# Patient Record
Sex: Male | Born: 1975 | Race: White | Hispanic: No | Marital: Single | Smoking: Current every day smoker
Health system: Southern US, Community
[De-identification: ages and names within clinical notes are randomized; demographics above are authoritative.]

## PROBLEM LIST (undated history)

## (undated) DIAGNOSIS — S7290XA Unspecified fracture of unspecified femur, initial encounter for closed fracture: Secondary | ICD-10-CM

## (undated) DIAGNOSIS — I731 Thromboangiitis obliterans [Buerger's disease]: Secondary | ICD-10-CM

## (undated) HISTORY — DX: Unspecified fracture of unspecified femur, initial encounter for closed fracture: S72.90XA

---

## 2004-03-21 ENCOUNTER — Emergency Department: Payer: Self-pay | Admitting: Emergency Medicine

## 2007-07-05 ENCOUNTER — Ambulatory Visit: Payer: Self-pay

## 2007-07-20 ENCOUNTER — Ambulatory Visit: Payer: Self-pay | Admitting: Orthopaedic Surgery

## 2007-07-26 ENCOUNTER — Ambulatory Visit: Payer: Self-pay | Admitting: Orthopaedic Surgery

## 2007-08-02 ENCOUNTER — Ambulatory Visit: Payer: Self-pay | Admitting: Specialist

## 2007-08-02 IMAGING — XA DG CHEST 1V
2 series · 2 of 2 positions shown · non-contrast
Comparison: none

REASON FOR EXAM: PICC Line Placement
COMMENTS:

PROCEDURE:     VAS - CHEST FRONTAL SINGLE VIEW  - [DATE]  [DATE]
RESULT:     Comparison: No available comparison exam.

[Series 1: run · 1 of 1 slices shown (1 of 2)]
[im 1/1]
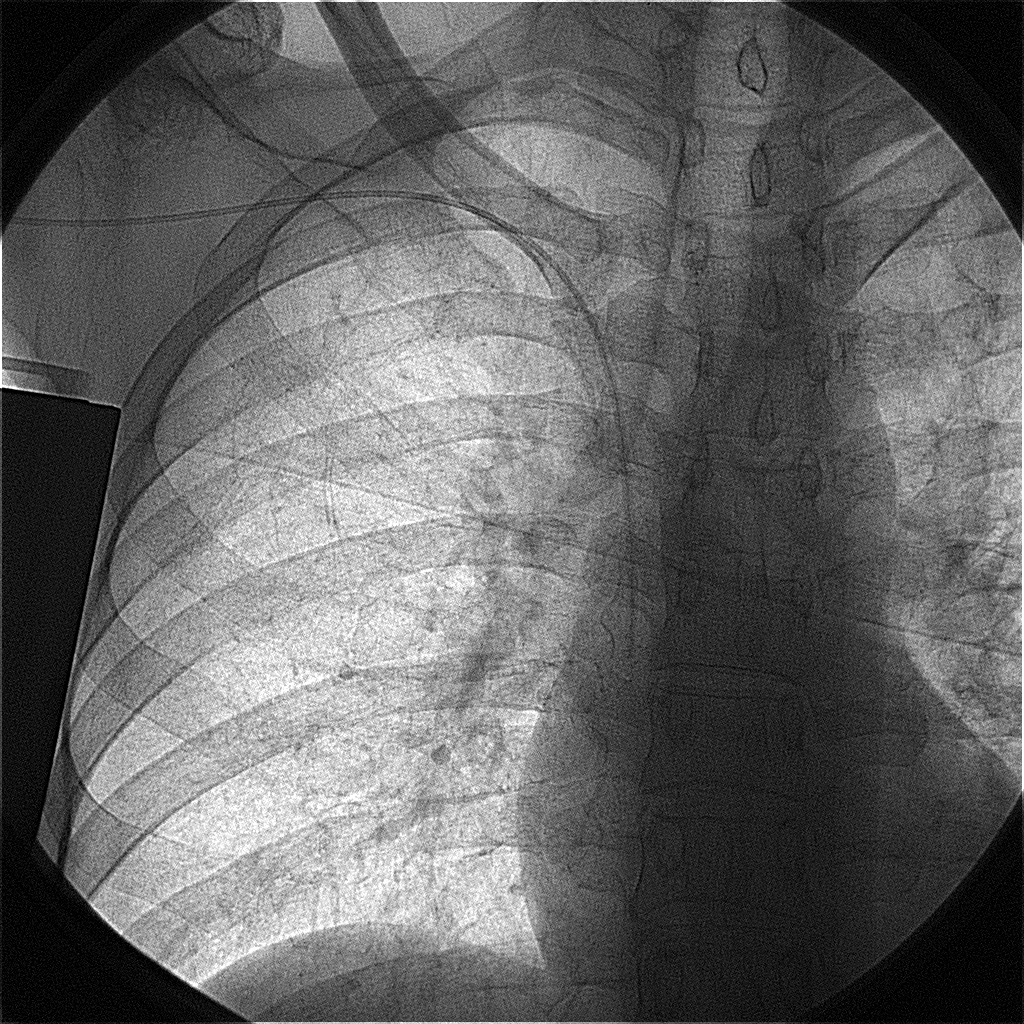

[Series 1: run · 1 of 1 slices shown (2 of 2)]
[im 1/1]
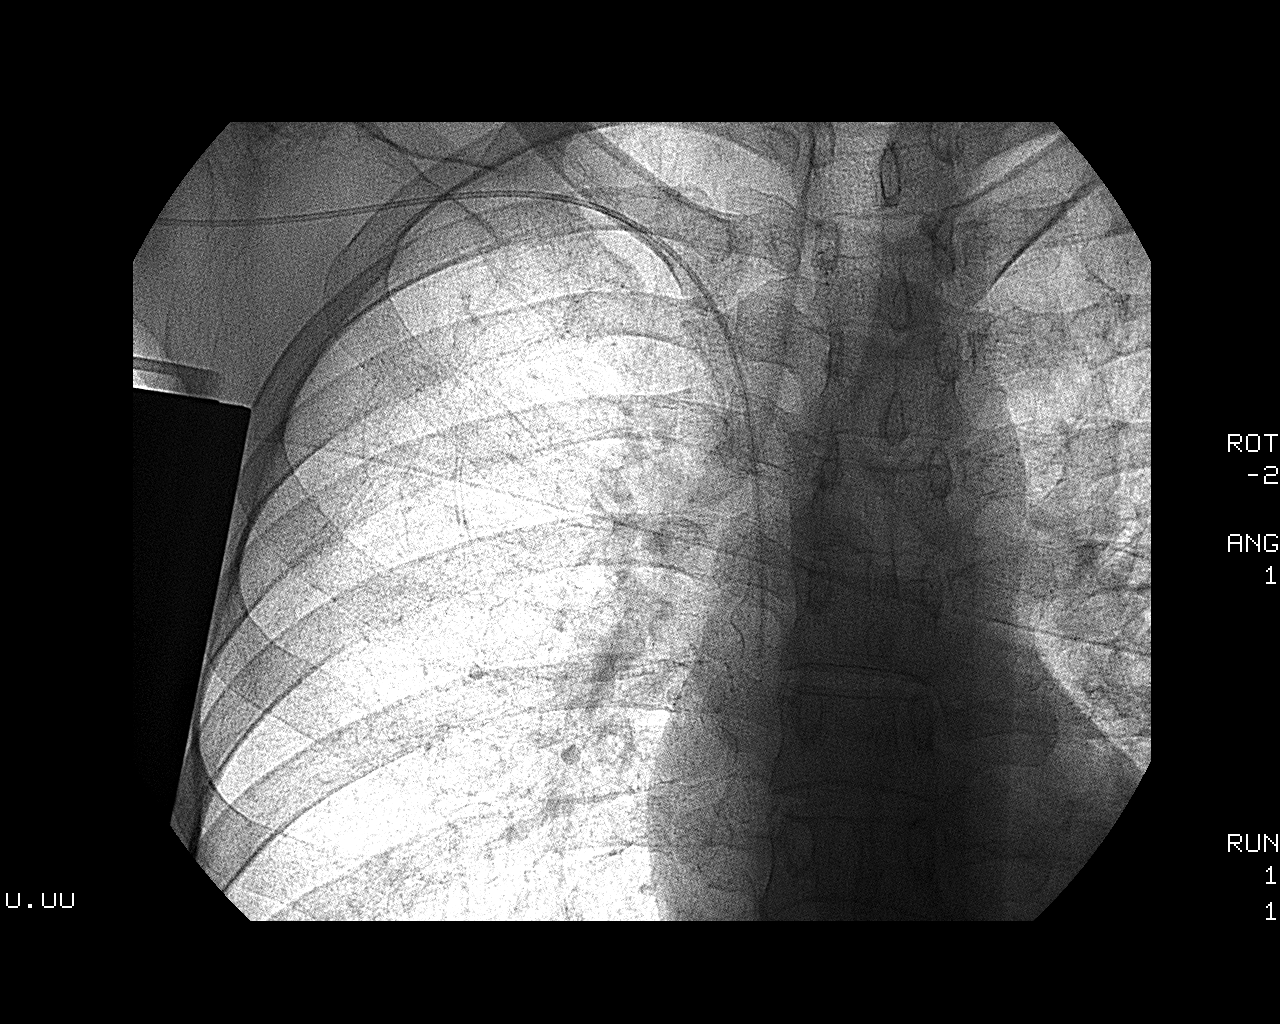

[2 of 2 positions shown; findings below may reference images not displayed]

FINDINGS: Fluoroscopic spot image of the right chest shows a right PICC with distal
tip at the SVC-RA junction.
IMPRESSION: 1. Right PICC with distal tip at the SVC-RA junction.

## 2007-08-31 ENCOUNTER — Ambulatory Visit: Payer: Self-pay | Admitting: Specialist

## 2010-12-25 ENCOUNTER — Observation Stay: Payer: Self-pay | Admitting: Internal Medicine

## 2016-05-23 DIAGNOSIS — I10 Essential (primary) hypertension: Secondary | ICD-10-CM | POA: Diagnosis not present

## 2016-05-23 DIAGNOSIS — Z0000 Encounter for general adult medical examination without abnormal findings: Secondary | ICD-10-CM | POA: Diagnosis not present

## 2016-05-23 DIAGNOSIS — Z79899 Other long term (current) drug therapy: Secondary | ICD-10-CM | POA: Diagnosis not present

## 2016-05-23 DIAGNOSIS — M5137 Other intervertebral disc degeneration, lumbosacral region: Secondary | ICD-10-CM | POA: Diagnosis not present

## 2016-06-13 DIAGNOSIS — Z125 Encounter for screening for malignant neoplasm of prostate: Secondary | ICD-10-CM | POA: Diagnosis not present

## 2016-06-13 DIAGNOSIS — Z79899 Other long term (current) drug therapy: Secondary | ICD-10-CM | POA: Diagnosis not present

## 2016-06-13 DIAGNOSIS — Z13220 Encounter for screening for lipoid disorders: Secondary | ICD-10-CM | POA: Diagnosis not present

## 2016-06-13 DIAGNOSIS — I10 Essential (primary) hypertension: Secondary | ICD-10-CM | POA: Diagnosis not present

## 2016-07-11 DIAGNOSIS — M5137 Other intervertebral disc degeneration, lumbosacral region: Secondary | ICD-10-CM | POA: Diagnosis not present

## 2016-07-11 DIAGNOSIS — I10 Essential (primary) hypertension: Secondary | ICD-10-CM | POA: Diagnosis not present

## 2016-08-19 DIAGNOSIS — D225 Melanocytic nevi of trunk: Secondary | ICD-10-CM | POA: Diagnosis not present

## 2016-08-19 DIAGNOSIS — Z0389 Encounter for observation for other suspected diseases and conditions ruled out: Secondary | ICD-10-CM | POA: Diagnosis not present

## 2016-08-19 DIAGNOSIS — M79645 Pain in left finger(s): Secondary | ICD-10-CM | POA: Diagnosis not present

## 2016-08-19 DIAGNOSIS — M79602 Pain in left arm: Secondary | ICD-10-CM | POA: Diagnosis not present

## 2016-09-13 ENCOUNTER — Other Ambulatory Visit: Payer: Self-pay | Admitting: Physical Medicine and Rehabilitation

## 2016-09-13 DIAGNOSIS — M5416 Radiculopathy, lumbar region: Secondary | ICD-10-CM

## 2016-09-13 DIAGNOSIS — M5136 Other intervertebral disc degeneration, lumbar region: Secondary | ICD-10-CM | POA: Diagnosis not present

## 2016-09-23 ENCOUNTER — Ambulatory Visit
Admission: RE | Admit: 2016-09-23 | Discharge: 2016-09-23 | Disposition: A | Discharge status: Home / Self Care | Payer: BLUE CROSS/BLUE SHIELD | Source: Ambulatory Visit | Attending: Physical Medicine and Rehabilitation | Admitting: Physical Medicine and Rehabilitation

## 2016-09-23 DIAGNOSIS — M5416 Radiculopathy, lumbar region: Secondary | ICD-10-CM

## 2016-09-23 DIAGNOSIS — M545 Low back pain: Secondary | ICD-10-CM | POA: Insufficient documentation

## 2016-11-21 DIAGNOSIS — M5137 Other intervertebral disc degeneration, lumbosacral region: Secondary | ICD-10-CM | POA: Diagnosis not present

## 2016-11-21 DIAGNOSIS — I10 Essential (primary) hypertension: Secondary | ICD-10-CM | POA: Diagnosis not present

## 2016-11-21 DIAGNOSIS — R202 Paresthesia of skin: Secondary | ICD-10-CM | POA: Diagnosis not present

## 2016-11-21 DIAGNOSIS — Z79899 Other long term (current) drug therapy: Secondary | ICD-10-CM | POA: Diagnosis not present

## 2016-11-21 DIAGNOSIS — M79642 Pain in left hand: Secondary | ICD-10-CM | POA: Diagnosis not present

## 2016-12-09 DIAGNOSIS — I731 Thromboangiitis obliterans [Buerger's disease]: Secondary | ICD-10-CM | POA: Diagnosis not present

## 2017-01-06 ENCOUNTER — Ambulatory Visit (INDEPENDENT_AMBULATORY_CARE_PROVIDER_SITE_OTHER): Payer: BLUE CROSS/BLUE SHIELD | Admitting: Vascular Surgery

## 2017-01-06 ENCOUNTER — Encounter (INDEPENDENT_AMBULATORY_CARE_PROVIDER_SITE_OTHER): Payer: Self-pay | Admitting: Vascular Surgery

## 2017-01-06 ENCOUNTER — Encounter (INDEPENDENT_AMBULATORY_CARE_PROVIDER_SITE_OTHER): Payer: Self-pay

## 2017-01-06 VITALS — BP 120/77 | HR 72 | Resp 16 | Ht 69.0 in | Wt 141.0 lb

## 2017-01-06 DIAGNOSIS — F17200 Nicotine dependence, unspecified, uncomplicated: Secondary | ICD-10-CM | POA: Diagnosis not present

## 2017-01-06 DIAGNOSIS — I731 Thromboangiitis obliterans [Buerger's disease]: Secondary | ICD-10-CM | POA: Insufficient documentation

## 2017-01-06 NOTE — Patient Instructions (Signed)
Angiogram  An angiogram is an X-ray test. It is used to look at your blood vessels. For this test, a dye is put into the blood vessel being checked. The dye shows up on X-rays. It helps your doctor see if there is a blockage or other problem in the blood vessel.  What happens before the procedure?   Follow your doctor's instructions about limiting what you eat or drink.   Ask your doctor if you may drink enough water to take any needed medicines the morning of the test.   Plan to have someone take you home after the test.   If you go home the same day as the test, plan to have someone stay with you for 24 hours.  What happens during the procedure?   An IV tube will be put into one of your veins.   You will be given a medicine that makes you relax (sedative).   Your skin will be washed where the thin tube (catheter) will be put in. Hair may be removed from this area. The tube may be put into:  ? Your upper leg area (groin).  ? The fold of your arm, near your elbow.  ? Your wrist.   You will be given a medicine that numbs the area where the tube will be inserted (local anesthetic).   The tube will be inserted into a blood vessel.   Using a type of X-ray (fluoroscopy) to see, your doctor will move the tube into the blood vessel to check it.   Dye will be put in through the tube. X-rays of your blood vessels will then be taken.  Different doctors and hospitals may do this procedure differently.  What happens after the procedure?   If the test is done through the leg, you will be kept in bed lying flat for several hours. You will be told to not bend or cross your legs.   The area where the tube was inserted will be checked often.   The pulse in your feet or wrist will be checked often.   More tests or X-rays may be done.  This information is not intended to replace advice given to you by your health care provider. Make sure you discuss any questions you have with your health care provider.  Document  Released: 07/01/2008 Document Revised: 09/10/2015 Document Reviewed: 09/05/2012  Elsevier Interactive Patient Education  2017 [employer]

## 2017-01-06 NOTE — Assessment & Plan Note (Signed)
The patient gives a reasonably typical history for Buerger's disease in a young healthy male with tobacco use and digital pain without another obvious source. I have discussed with him the pathophysiology and natural history of Buerger's disease. I discussed that other etiologies are certainly possible such as were nodes phenomenon or embolic source from the proximal arterial lesion. I recommended an angiogram for full evaluation of the extremity perfusion and this would be the only reliable way to definitively diagnose Buerger's disease. I have discussed the risks and benefits of angiography. The patient desires to have this performed and we will proceed as such I recommended he take an 81 mg aspirin a day and has described above recommended tobacco cessation.

## 2017-01-06 NOTE — Assessment & Plan Note (Signed)
We had a discussion for approximately 3-4 minutes regarding the absolute need for smoking cessation due to the deleterious nature of tobacco on the vascular system. We discussed the tobacco use would diminish patency of any intervention, and likely significantly worsen progressio of disease. We discussed multiple agents for quitting including replacement therapy or medications to reduce cravings such as Chantix. The patient voices their understanding of the importance of smoking cessation. We discussed that if he does indeed had Buerger's disease, the only reliable treatment for this smoking cessation and without smoking cessation he is likely to have digital loss and other tissue loss occur.

## 2017-01-06 NOTE — Progress Notes (Signed)
Patient ID: Jamie Buchanan, male   DOB: 02/28/1976, 41 y.o.   MRN: [id number]  Chief Complaint  Patient presents with  . New Patient (Initial Visit)    Middle finger cold and numb    HPI Jamie Buchanan is a 41 y.o. male.  I am asked to see the patient by Dr. Rosita Kea for evaluation of finger pain and possible Buerger's disease.  The patient reports Several months ago having pain in his left third finger. This is exacerbated by cold stimulation. It has made it very difficult to work and the finger is very sensitive to the touch. He denies any other extremity pain or problems at current. He has long-standing tobacco use. He denies any trauma or injury inciting event that caused the symptoms. Nothing has made this better over the past several months. Cold stimulation significantly worsens the pain. He denies symptoms in the right hand or the feet.   Past Medical History:  Diagnosis Date  . Broken femur (HCC)    Age 61    No past surgical history on file.  Family History  Problem Relation Age of Onset  . Diabetes Mother   . Heart disease Mother   . Sinusitis Father   No bleeding disorders or clotting disorders  Social History Social History  Substance Use Topics  . Smoking status: Current Every Day Smoker  . Smokeless tobacco: Never Used  . Alcohol use Yes  No IV drug use  Allergies  Allergen Reactions  . Penicillin G Rash    Current Outpatient Prescriptions  Medication Sig Dispense Refill  . bisoprolol-hydrochlorothiazide (ZIAC) 5-6.25 MG tablet Take by mouth.    . gabapentin (NEURONTIN) 300 MG capsule Take by mouth.     No current facility-administered medications for this visit.       REVIEW OF SYSTEMS (Negative unless checked)  Constitutional: Weight loss  Fever  Chills Cardiac: Chest pain   Chest pressure   Palpitations   Shortness of breath when laying flat   Shortness of breath at rest   Shortness of breath with exertion. Vascular:  Pain in  legs with walking   Pain in legs at rest   Pain in legs when laying flat   Claudication   Pain in feet when walking  Pain in feet at rest  Pain in feet when laying flat   History of DVT   Phlebitis   Swelling in legs   Varicose veins   Non-healing ulcers Pulmonary:   Uses home oxygen   Productive cough   Hemoptysis   Wheeze  COPD   Asthma Neurologic:  Dizziness  Blackouts   Seizures   History of stroke   History of TIA  Aphasia   Temporary blindness   Dysphagia   Weakness or numbness in arms   Weakness or numbness in legs Musculoskeletal:  Arthritis   Joint swelling   Joint pain   Low back pain Hematologic:  Easy bruising  Easy bleeding   Hypercoagulable state   Anemic  Hepatitis Gastrointestinal:  Blood in stool   Vomiting blood  Gastroesophageal reflux/heartburn   Abdominal pain Genitourinary:  Chronic kidney disease   Difficult urination  Frequent urination  Burning with urination   Hematuria Skin:  Rashes   Ulcers   Wounds Psychological:  History of anxiety    History of major depression.    Physical Exam BP 120/77 (BP Location: Right Arm)   Pulse 72   Resp 16   Ht  (  1.753 m)   Wt 141 lb (64 kg)   BMI 20.82 kg/m  Gen:  WD/WN, NAD Head: [place]/AT, No temporalis wasting.  Ear/Nose/Throat: Hearing grossly intact, nares w/o erythema or drainage, oropharynx w/o Erythema/Exudate Eyes: Conjunctiva clear, sclera non-icteric  Neck: trachea midline.  No JVD.  Pulmonary:  Good air movement, respirations not labored, no use of accessory muscles Cardiac: RRR, no JVD Vascular:  Vessel Right Left  Radial Palpable Palpable  Ulnar Palpable Palpable  Brachial Palpable Palpable                            Musculoskeletal: M/S 5/5 throughout.  No deformity or atrophy. No edema. Appearance of a scab at the distal aspect of the left third finger with no open ulceration or infection. Neurologic:  Sensation grossly intact in extremities.  Symmetrical.  Speech is fluent. Motor exam as listed above. Psychiatric: Judgment intact, Mood & affect appropriate for pt's clinical situation. Dermatologic: No rashes or ulcers noted.  No cellulitis or open wounds.    Radiology No results found.  Labs No results found for this or any previous visit (from the past 2160 hour(s)).  Assessment/Plan:  Tobacco use disorder We had a discussion for approximately 3-4 minutes regarding the absolute need for smoking cessation due to the deleterious nature of tobacco on the vascular system. We discussed the tobacco use would diminish patency of any intervention, and likely significantly worsen progressio of disease. We discussed multiple agents for quitting including replacement therapy or medications to reduce cravings such as Chantix. The patient voices their understanding of the importance of smoking cessation. We discussed that if he does indeed had Buerger's disease, the only reliable treatment for this smoking cessation and without smoking cessation he is likely to have digital loss and other tissue loss occur.   Buerger's disease (HCC) The patient gives a reasonably typical history for Buerger's disease in a young healthy male with tobacco use and digital pain without another obvious source. I have discussed with him the pathophysiology and natural history of Buerger's disease. I discussed that other etiologies are certainly possible such as were nodes phenomenon or embolic source from the proximal arterial lesion. I recommended an angiogram for full evaluation of the extremity perfusion and this would be the only reliable way to definitively diagnose Buerger's disease. I have discussed the risks and benefits of angiography. The patient desires to have this performed and we will proceed as such I recommended he take an 81 mg aspirin a day and has described above recommended tobacco cessation.      Festus Barren 01/06/2017, 9:42 AM   This note was created with Dragon medical transcription system.  Any errors from dictation are unintentional.

## 2017-01-09 ENCOUNTER — Other Ambulatory Visit (INDEPENDENT_AMBULATORY_CARE_PROVIDER_SITE_OTHER): Payer: Self-pay | Admitting: Vascular Surgery

## 2017-01-15 MED ORDER — CLINDAMYCIN PHOSPHATE 300 MG/50ML IV SOLN
300.0000 mg | Freq: Once | INTRAVENOUS | Status: AC
  Administered 2017-01-16: 300 mg via INTRAVENOUS

## 2017-01-16 ENCOUNTER — Encounter
Admission: RE | Disposition: A | Discharge status: Home / Self Care | Payer: Self-pay | Source: Ambulatory Visit | Attending: Vascular Surgery

## 2017-01-16 ENCOUNTER — Encounter: Payer: Self-pay | Admitting: *Deleted

## 2017-01-16 ENCOUNTER — Ambulatory Visit
Admission: RE | Admit: 2017-01-16 | Discharge: 2017-01-16 | Disposition: A | Discharge status: Home / Self Care | Payer: BLUE CROSS/BLUE SHIELD | Source: Ambulatory Visit | Attending: Vascular Surgery | Admitting: Vascular Surgery

## 2017-01-16 DIAGNOSIS — Z8249 Family history of ischemic heart disease and other diseases of the circulatory system: Secondary | ICD-10-CM | POA: Insufficient documentation

## 2017-01-16 DIAGNOSIS — F17200 Nicotine dependence, unspecified, uncomplicated: Secondary | ICD-10-CM | POA: Diagnosis not present

## 2017-01-16 DIAGNOSIS — I998 Other disorder of circulatory system: Secondary | ICD-10-CM | POA: Diagnosis not present

## 2017-01-16 DIAGNOSIS — Z880 Allergy status to penicillin: Secondary | ICD-10-CM | POA: Insufficient documentation

## 2017-01-16 DIAGNOSIS — Z833 Family history of diabetes mellitus: Secondary | ICD-10-CM | POA: Insufficient documentation

## 2017-01-16 HISTORY — PX: UPPER EXTREMITY VENOGRAPHY: CATH118272

## 2017-01-16 HISTORY — DX: Thromboangiitis obliterans (Buerger's disease): I73.1

## 2017-01-16 LAB — CBC
HEMATOCRIT: 59.3 % — AB (ref 40.0–52.0)
HEMOGLOBIN: 20 g/dL — AB (ref 13.0–18.0)
MCH: 32 pg (ref 26.0–34.0)
MCHC: 33.7 g/dL (ref 32.0–36.0)
MCV: 94.8 fL (ref 80.0–100.0)
Platelets: 283 10*3/uL (ref 150–440)
RBC: 6.26 MIL/uL — ABNORMAL HIGH (ref 4.40–5.90)
RDW: 14.5 % (ref 11.5–14.5)
WBC: 12.4 10*3/uL — ABNORMAL HIGH (ref 3.8–10.6)

## 2017-01-16 LAB — CREATININE, SERUM
Creatinine, Ser: 1.13 mg/dL (ref 0.61–1.24)
GFR calc Af Amer: >60 mL/min (ref >60)
GFR calc non Af Amer: >60 mL/min (ref >60)

## 2017-01-16 LAB — BUN: BUN: 11 mg/dL (ref 6–20)

## 2017-01-16 LAB — GLUCOSE, CAPILLARY
GLUCOSE-CAPILLARY: 49 mg/dL — AB (ref 65–99)
Glucose-Capillary: 92 mg/dL (ref 65–99)

## 2017-01-16 SURGERY — UPPER EXTREMITY VENOGRAPHY
Anesthesia: Moderate Sedation | Laterality: Bilateral

## 2017-01-16 MED ORDER — HEPARIN SODIUM (PORCINE) 1000 UNIT/ML IJ SOLN
INTRAMUSCULAR | Status: AC
  Filled 2017-01-16: qty 1

## 2017-01-16 MED ORDER — ONDANSETRON HCL 4 MG/2ML IJ SOLN: 4.0000 mg | Freq: Four times a day (QID) | INTRAMUSCULAR | Status: DC | PRN

## 2017-01-16 MED ORDER — SODIUM CHLORIDE 0.9 % IV SOLN: INTRAVENOUS | Status: DC

## 2017-01-16 MED ORDER — HYDRALAZINE HCL 20 MG/ML IJ SOLN: 5.0000 mg | INTRAMUSCULAR | Status: DC | PRN

## 2017-01-16 MED ORDER — METHYLPREDNISOLONE SODIUM SUCC 125 MG IJ SOLR: 125.0000 mg | INTRAMUSCULAR | Status: DC | PRN

## 2017-01-16 MED ORDER — ASPIRIN EC 81 MG PO TBEC: 81.0000 mg | DELAYED_RELEASE_TABLET | Freq: Every day | ORAL | Status: DC

## 2017-01-16 MED ORDER — IOPAMIDOL (ISOVUE-300) INJECTION 61%
INTRAVENOUS | Status: DC | PRN
  Administered 2017-01-16: 50 mL via INTRA_ARTERIAL

## 2017-01-16 MED ORDER — ASPIRIN EC 81 MG PO TBEC: 81.0000 mg | DELAYED_RELEASE_TABLET | Freq: Every day | ORAL | 2 refills | Status: AC

## 2017-01-16 MED ORDER — SODIUM CHLORIDE 0.9 % IV SOLN: 250.0000 mL | INTRAVENOUS | Status: DC | PRN

## 2017-01-16 MED ORDER — MIDAZOLAM HCL 5 MG/5ML IJ SOLN
INTRAMUSCULAR | Status: AC
  Filled 2017-01-16: qty 5

## 2017-01-16 MED ORDER — FAMOTIDINE 20 MG PO TABS: 40.0000 mg | ORAL_TABLET | ORAL | Status: DC | PRN

## 2017-01-16 MED ORDER — FENTANYL CITRATE (PF) 100 MCG/2ML IJ SOLN
INTRAMUSCULAR | Status: AC
  Filled 2017-01-16: qty 2

## 2017-01-16 MED ORDER — LIDOCAINE-EPINEPHRINE (PF) 1 %-1:200000 IJ SOLN
INTRAMUSCULAR | Status: AC
  Filled 2017-01-16: qty 30

## 2017-01-16 MED ORDER — SODIUM CHLORIDE 0.9% FLUSH: 3.0000 mL | INTRAVENOUS | Status: DC | PRN

## 2017-01-16 MED ORDER — FENTANYL CITRATE (PF) 100 MCG/2ML IJ SOLN
INTRAMUSCULAR | Status: DC | PRN
  Administered 2017-01-16: 50 ug via INTRAVENOUS
  Administered 2017-01-16: 25 ug via INTRAVENOUS

## 2017-01-16 MED ORDER — SODIUM CHLORIDE 0.9% FLUSH: 3.0000 mL | Freq: Two times a day (BID) | INTRAVENOUS | Status: DC

## 2017-01-16 MED ORDER — HYDROMORPHONE HCL 1 MG/ML IJ SOLN: 1.0000 mg | Freq: Once | INTRAMUSCULAR | Status: DC | PRN

## 2017-01-16 MED ORDER — LABETALOL HCL 5 MG/ML IV SOLN: 10.0000 mg | INTRAVENOUS | Status: DC | PRN

## 2017-01-16 MED ORDER — HEPARIN (PORCINE) IN NACL 2-0.9 UNIT/ML-% IJ SOLN
INTRAMUSCULAR | Status: AC
  Filled 2017-01-16: qty 1000

## 2017-01-16 MED ORDER — DEXTROSE 50 % IV SOLN
INTRAVENOUS | Status: AC
  Administered 2017-01-16: 50 mL
  Filled 2017-01-16: qty 50

## 2017-01-16 MED ORDER — MIDAZOLAM HCL 2 MG/2ML IJ SOLN
INTRAMUSCULAR | Status: DC | PRN
  Administered 2017-01-16: 2 mg via INTRAVENOUS
  Administered 2017-01-16: 1 mg via INTRAVENOUS

## 2017-01-16 MED ORDER — HEPARIN SODIUM (PORCINE) 1000 UNIT/ML IJ SOLN
INTRAMUSCULAR | Status: DC | PRN
  Administered 2017-01-16: 3000 [IU] via INTRAVENOUS

## 2017-01-16 MED ORDER — CLINDAMYCIN PHOSPHATE 300 MG/50ML IV SOLN
INTRAVENOUS | Status: AC
  Filled 2017-01-16: qty 50

## 2017-01-16 SURGICAL SUPPLY — 10 items
CATH ANGIO 5F 100CM .035 PIG (CATHETERS) ×3 IMPLANT
CATH BEACON 5 .035 100 H1 TIP (CATHETERS) ×3 IMPLANT
CATH CXI SUPP ANG 4FR 135 (MICROCATHETER) ×1 IMPLANT
CATH CXI SUPP ANG 4FR 135CM (MICROCATHETER) ×3
DEVICE STARCLOSE SE CLOSURE (Vascular Products) ×3 IMPLANT
PACK ANGIOGRAPHY (CUSTOM PROCEDURE TRAY) ×3 IMPLANT
SHEATH BRITE TIP 5FRX11 (SHEATH) ×3 IMPLANT
SYR MEDRAD MARK V 150ML (SYRINGE) ×3 IMPLANT
TUBING CONTRAST HIGH PRESS 72 (TUBING) ×3 IMPLANT
WIRE J 3MM .035X145CM (WIRE) ×3 IMPLANT

## 2017-01-16 NOTE — H&P (Signed)
[facility] History & Physical Update  The patient was interviewed and re-examined.  The patient's previous History and Physical has been reviewed and is unchanged.  There is no change in the plan of care. We plan to proceed with the scheduled procedure.  Festus Barren, MD  01/16/2017, 10:22 AM

## 2017-01-16 NOTE — Progress Notes (Signed)
Blood sugar up to 90's, shivering, asking for extra blankets. Dr. Wyn Quaker notified.

## 2017-01-16 NOTE — Discharge Instructions (Signed)

## 2017-01-16 NOTE — OR Nursing (Signed)
10:20 came in for vascular procedure. Diaphoretic and c/o of feeling very weak.   Blood sugar 48. MD notified and 1 amp D50 given IV.

## 2017-01-16 NOTE — Op Note (Signed)
OPERATIVE REPORT   PREOPERATIVE DIAGNOSIS: 1. ischemia left third finger 2.  suspected Buerger's disease 3. Tobacco dependence  POSTOPERATIVE DIAGNOSIS: Ischemia left third finger, tobacco dependence, no evidence of Buerger's disease  PROCEDURE PERFORMED: 1. Ultrasound guidance vascular access to right femoral artery. 2. Catheter placement to  bilateral radial arteries and bilateral ulnar arteries  from right femoral approach. 3. Thoracic aortogram and selective bilateral upper extremity angiogram  including selective images of the radial and ulnar arteriesbilaterally 4. StarClose closure device right femoral artery.  SURGEON: Annice Needy, MD  ANESTHESIA: Local with moderate conscious sedation for 30 minutes using 3 mg of Versed and 75 mcg of Fentanyl  BLOOD LOSS: Minimal.  FLUOROSCOPY TIME: 5.4 minutes  INDICATION FOR PROCEDURE: This is a 41 y.o.male who presented to our office with ischemia of the left third finger. He was young with a heavy history of tobacco use and we suspected Buerger's disease. Angiogram is performed for delineation of his arterial anatomy as well as assessment of the right upper extremity vascular disease. Risks and benefits are discussed. Informed consent was obtained.  DESCRIPTION OF PROCEDURE: The patient was brought to the vascular suite. Moderate conscious sedation was administered during a face to face encounter with the patient throughout the procedure with my supervision of the RN administering medicines and monitoring the patient's vital signs, pulse oximetry, telemetry and mental status throughout from the start of the procedure until the patient was taken to the recovery room.  Groins were shaved and prepped and sterile surgical field was created. The right femoral head was localized with fluoroscopy and the right femoral artery was then visualized with ultrasound and found to be widely patent. It was then accessed  under direct ultrasound guidance without difficulty with a Seldinger needle and a permanent image was recorded. A J-wire and 5-French sheath were then placed. Pigtail catheter was placed into the ascending aorta and a thoracic aortogram was then performed in the LAO projection. This demonstrated normal origins to the great vessels without significant proximal stenoses and a normal configuration of the great vessels. The patient was given 3000 units of intravenous heparin and a headhunter catheter was used to selectively cannulate the left subclavian artery without difficulty. This was then sequentially advanced to the brachial artery and to the brachial bifurcationafter exchange for a CXI catheter to get past the brachial bifurcation and selective imaging of both the radial and ulnar arteries were performed. findings of left upper extremity demonstrated no significant stenosis in the left subclavian artery, axillary artery, brachial artery, with a normal brachial bifurcation. The radial artery was larger artery into the hand and was continuous without significant stenosis. The ulnar artery was smaller but was also continuous without focal stenosis. He had an incomplete palmar arch and there was very poor filling of the third digit from either the radial or the ulnar approach. There were no corkscrew appearances or signs of Buerger's disease. I then elected to evaluate the right arm. We will back to the thoracic aortogram projection and used a headhunter catheter to selectively cannulate the innominate artery without difficulty using a Glidewire. I then advanced into the right subclavian artery and sequentially advanced to the brachial artery which was the limit on the length of the catheter. I exchanged for a CXI catheter and first cannulated the radial artery for selective imaging and then pulled back and cannulated the ulnar artery for selective imaging into the hand. Findings on the right upper  extremity demonstrated no significant stenosis  in the innominate artery, right subclavian artery, axillary artery, or brachial artery. The radial artery was again the larger artery into the hand but did not demonstrate great cross filling through the palmar arch to digits 4 and 5. The third digit was better perfused. Selective imaging of the ulnar artery did show cross filling into all 5 digits with a better palmar arch that was seen on the left hand. No focal stenosis was seen in either the radial or the ulnar artery. No evidence of Buerger's disease or corkscrew appearance of collaterals was seen. The diagnostic catheter was removed. Oblique arteriogram was performed of the right femoral artery and StarClose closure device deployed in the usual fashion with excellent hemostatic result. The patient tolerated the procedure well and was taken to the recovery room in stable condition.   Jamie Buchanan 01/16/2017 12:58 PM

## 2017-01-17 ENCOUNTER — Encounter: Payer: Self-pay | Admitting: Vascular Surgery

## 2017-01-31 ENCOUNTER — Ambulatory Visit (INDEPENDENT_AMBULATORY_CARE_PROVIDER_SITE_OTHER): Payer: BLUE CROSS/BLUE SHIELD | Admitting: Vascular Surgery

## 2017-01-31 ENCOUNTER — Encounter (INDEPENDENT_AMBULATORY_CARE_PROVIDER_SITE_OTHER): Payer: Self-pay | Admitting: Vascular Surgery

## 2017-01-31 VITALS — BP 141/72 | HR 86 | Resp 15 | Ht 69.0 in | Wt 142.0 lb

## 2017-01-31 DIAGNOSIS — I731 Thromboangiitis obliterans [Buerger's disease]: Secondary | ICD-10-CM | POA: Diagnosis not present

## 2017-01-31 DIAGNOSIS — F17200 Nicotine dependence, unspecified, uncomplicated: Secondary | ICD-10-CM | POA: Diagnosis not present

## 2017-01-31 NOTE — Progress Notes (Signed)
Subjective:    Patient ID: Jamie Buchanan, male    DOB: 20-Nov-1975, 41 y.o.   MRN: [id number] Chief Complaint  Patient presents with  . Follow-up    2 week f/u    Patient presents for his first post procedure follow-up. He is status post a ultrasound guidance vascular access to right femoral artery. Catheter placement to  bilateral radial arteries and bilateral ulnar arteries from right femoral approach. Thoracic aortogram and selective bilateral upper extremity angiogram including selective images of the radial and ulnar arteries bilaterally with StarClose closure device right femoral artery. His symptoms persist status post the above procedure. Patient continues having pain in his left third finger. Denies any pallor or necrosis to the finger. The patient has been taking  aspirin daily. Patient has not stopped smoking tobacco as of yet. Denies any worsening of the pain. Denies any fever, nausea or vomiting.   Review of Systems  Constitutional: Negative.   HENT: Negative.   Eyes: Negative.   Respiratory: Negative.   Cardiovascular:       Finger pain  Gastrointestinal: Negative.   Endocrine: Negative.   Genitourinary: Negative.   Musculoskeletal: Negative.   Skin: Negative.   Allergic/Immunologic: Negative.   Neurological: Negative.   Hematological: Negative.   Psychiatric/Behavioral: Negative.       Objective:   Physical Exam  Constitutional: He is oriented to person, place, and time. He appears well-developed and well-nourished. No distress.  HENT:  Head: Normocephalic and atraumatic.  Eyes: Pupils are equal, round, and reactive to light. Conjunctivae are normal.  Neck: Normal range of motion.  Cardiovascular: Normal rate, regular rhythm, normal heart sounds and intact distal pulses.   Pulses:      Radial pulses are 2+ on the right side, and 2+ on the left side.  The bilateral hand and fingers are warm and pink. Skin is intact. There is no signs of ischemia. Skin is  intact.  Pulmonary/Chest: Effort normal.  Musculoskeletal: Normal range of motion. He exhibits no edema.  Neurological: He is alert and oriented to person, place, and time.  Skin: Skin is warm and dry. He is not diaphoretic.  Psychiatric: He has a normal mood and affect. His behavior is normal. Judgment and thought content normal.  Vitals reviewed.  BP (!) 141/72 (BP Location: Right Arm)   Pulse 86   Resp 15   Ht  (1.753 m)   Wt 142 lb (64.4 kg)   BMI 20.97 kg/m   Past Medical History:  Diagnosis Date  . Broken femur (HCC)    Age 41  . Buerger disease (HCC)    Social History   Social History  . Marital status: Single    Spouse name: N/A  . Number of children: N/A  . Years of education: N/A   Occupational History  . Not on file.   Social History Main Topics  . Smoking status: Current Every Day Smoker    Packs/day: 2.00    Years: 15.00  . Smokeless tobacco: Never Used  . Alcohol use Yes  . Drug use: Unknown  . Sexual activity: Not on file   Other Topics Concern  . Not on file   Social History Narrative  . No narrative on file   Past Surgical History:  Procedure Laterality Date  . UPPER EXTREMITY VENOGRAPHY Bilateral 01/16/2017   Procedure: Upper Extremity Venography;  Surgeon: Annice Needy, MD;  Location: [facility];  Service: Cardiovascular;  Laterality: Bilateral;  Family History  Problem Relation Age of Onset  . Diabetes Mother   . Heart disease Mother   . Sinusitis Father    Allergies  Allergen Reactions  . Penicillin G Rash      Assessment & Plan:  Patient presents for his first post procedure follow-up. He is status post a ultrasound guidance vascular access to right femoral artery. Catheter placement to  bilateral radial arteries and bilateral ulnar arteries from right femoral approach. Thoracic aortogram and selective bilateral upper extremity angiogram including selective images of the radial and ulnar arteries bilaterally with  StarClose closure device right femoral artery. His symptoms persist status post the above procedure. Patient continues having pain in his left third finger. Denies any pallor or necrosis to the finger. The patient has been taking  aspirin daily. Patient has not stopped smoking tobacco as of yet. Denies any worsening of the pain. Denies any fever, nausea or vomiting.  1. Buerger's disease (HCC) - Negative Patient did not have this on angiography. Vasculature normal. Patient to continue taking aspirin.  There is the options of a calcium channel blocker however this would not add much improvement to the patient's symptoms Patient to follow up in 6 months or sooner if needed Patient will call to make this appointment  2. Tobacco use disorder - stable We had a discussion for approximately 10 minutes regarding the absolute need for smoking cessation due to the deleterious nature of tobacco on the vascular system. We discussed the tobacco use would diminish patency of any intervention, and likely significantly worsen progressio of disease. We discussed multiple agents for quitting including replacement therapy or medications to reduce cravings such as Chantix. The patient voices their understanding of the importance of smoking cessation.  Current Outpatient Prescriptions on File Prior to Visit  Medication Sig Dispense Refill  . aspirin EC 81 MG tablet Take 1 tablet (81 mg total) by mouth daily. 150 tablet 2   No current facility-administered medications on file prior to visit.     There are no Patient Instructions on file for this visit. No Follow-up on file.   Tam Savoia A Quida Glasser, PA-C

## 2017-02-01 ENCOUNTER — Telehealth (INDEPENDENT_AMBULATORY_CARE_PROVIDER_SITE_OTHER): Payer: Self-pay

## 2017-02-01 NOTE — Telephone Encounter (Signed)
Patient's mother walked into the office to find out why her son saw a PA instead of the doctor and wanted to know why he has not been referred to a hematologist for his blood. I spoke with Selena BattenKim the PA and was told she asked Dr.Dew about the patient and was told that he does not have Buerger's disease, he should take 81 mg Aspirin and stop smoking. There isn't a referral from the hospital under Dr. Wyn Quakerew for a hematologist referral.  Patient's mother seemed a little perturbed that there wasn't a referral for a hematologist visit. I did let her know that she could make an appt for her son to be seen by a hematologist.

## 2017-03-06 ENCOUNTER — Ambulatory Visit
Admission: RE | Admit: 2017-03-06 | Discharge: 2017-03-06 | Disposition: A | Discharge status: Home / Self Care | Payer: BLUE CROSS/BLUE SHIELD | Source: Ambulatory Visit | Attending: Hematology and Oncology | Admitting: Hematology and Oncology

## 2017-03-06 ENCOUNTER — Encounter: Payer: Self-pay | Admitting: Hematology and Oncology

## 2017-03-06 ENCOUNTER — Inpatient Hospital Stay: Payer: BLUE CROSS/BLUE SHIELD | Attending: Hematology and Oncology | Admitting: Hematology and Oncology

## 2017-03-06 ENCOUNTER — Encounter (INDEPENDENT_AMBULATORY_CARE_PROVIDER_SITE_OTHER): Payer: Self-pay

## 2017-03-06 ENCOUNTER — Ambulatory Visit: Payer: BLUE CROSS/BLUE SHIELD

## 2017-03-06 ENCOUNTER — Ambulatory Visit
Admission: RE | Admit: 2017-03-06 | Discharge: 2017-03-06 | Disposition: A | Discharge status: Home / Self Care | Payer: BLUE CROSS/BLUE SHIELD | Source: Ambulatory Visit | Attending: Urgent Care | Admitting: Urgent Care

## 2017-03-06 ENCOUNTER — Other Ambulatory Visit: Payer: Self-pay

## 2017-03-06 ENCOUNTER — Inpatient Hospital Stay: Payer: BLUE CROSS/BLUE SHIELD

## 2017-03-06 VITALS — BP 134/88 | HR 71 | Temp 97.8°F | Resp 16 | Wt 144.5 lb

## 2017-03-06 VITALS — BP 144/86 | HR 76 | Resp 16

## 2017-03-06 DIAGNOSIS — Z79899 Other long term (current) drug therapy: Secondary | ICD-10-CM | POA: Diagnosis not present

## 2017-03-06 DIAGNOSIS — D751 Secondary polycythemia: Secondary | ICD-10-CM

## 2017-03-06 DIAGNOSIS — E538 Deficiency of other specified B group vitamins: Secondary | ICD-10-CM | POA: Insufficient documentation

## 2017-03-06 DIAGNOSIS — F1010 Alcohol abuse, uncomplicated: Secondary | ICD-10-CM | POA: Diagnosis not present

## 2017-03-06 DIAGNOSIS — D45 Polycythemia vera: Secondary | ICD-10-CM | POA: Diagnosis not present

## 2017-03-06 DIAGNOSIS — R202 Paresthesia of skin: Secondary | ICD-10-CM | POA: Diagnosis not present

## 2017-03-06 DIAGNOSIS — F17210 Nicotine dependence, cigarettes, uncomplicated: Secondary | ICD-10-CM | POA: Diagnosis not present

## 2017-03-06 LAB — URINALYSIS, COMPLETE (UACMP) WITH MICROSCOPIC
BILIRUBIN URINE: NEGATIVE
Bacteria, UA: NONE SEEN
GLUCOSE, UA: NEGATIVE mg/dL
Hgb urine dipstick: NEGATIVE
KETONES UR: NEGATIVE mg/dL
LEUKOCYTES UA: NEGATIVE
Nitrite: NEGATIVE
PROTEIN: NEGATIVE mg/dL
Specific Gravity, Urine: 1.017 (ref 1.005–1.030)
pH: 5 (ref 5.0–8.0)

## 2017-03-06 LAB — CBC WITH DIFFERENTIAL/PLATELET
Basophils Absolute: 0.1 10*3/uL (ref 0–0.1)
Basophils Relative: 1 %
Eosinophils Absolute: 0.2 10*3/uL (ref 0–0.7)
Eosinophils Relative: 3 %
HCT: 60.2 % — ABNORMAL HIGH (ref 40.0–52.0)
HEMOGLOBIN: 20.5 g/dL — AB (ref 13.0–18.0)
LYMPHS ABS: 1.4 10*3/uL (ref 1.0–3.6)
LYMPHS PCT: 21 %
MCH: 32.3 pg (ref 26.0–34.0)
MCHC: 34 g/dL (ref 32.0–36.0)
MCV: 95.2 fL (ref 80.0–100.0)
Monocytes Absolute: 0.7 10*3/uL (ref 0.2–1.0)
Monocytes Relative: 10 %
NEUTROS PCT: 65 %
Neutro Abs: 4.2 10*3/uL (ref 1.4–6.5)
Platelets: 264 10*3/uL (ref 150–440)
RBC: 6.33 MIL/uL — AB (ref 4.40–5.90)
RDW: 13.9 % (ref 11.5–14.5)
WBC: 6.5 10*3/uL (ref 3.8–10.6)

## 2017-03-06 LAB — COMPREHENSIVE METABOLIC PANEL
ALK PHOS: 66 U/L (ref 38–126)
ALT: 16 U/L — AB (ref 17–63)
ANION GAP: 8 (ref 5–15)
AST: 23 U/L (ref 15–41)
Albumin: 4.1 g/dL (ref 3.5–5.0)
BUN: 12 mg/dL (ref 6–20)
CALCIUM: 9.1 mg/dL (ref 8.9–10.3)
CO2: 29 mmol/L (ref 22–32)
CREATININE: 0.89 mg/dL (ref 0.61–1.24)
Chloride: 99 mmol/L — ABNORMAL LOW (ref 101–111)
GLUCOSE: 93 mg/dL (ref 65–99)
Potassium: 4.7 mmol/L (ref 3.5–5.1)
SODIUM: 136 mmol/L (ref 135–145)
TOTAL PROTEIN: 7.6 g/dL (ref 6.5–8.1)
Total Bilirubin: 0.7 mg/dL (ref 0.3–1.2)

## 2017-03-06 LAB — IRON AND TIBC
Iron: 75 ug/dL (ref 45–182)
Saturation Ratios: 19 % (ref 17.9–39.5)
TIBC: 391 ug/dL (ref 250–450)
UIBC: 316 ug/dL

## 2017-03-06 LAB — FERRITIN: Ferritin: 75 ng/mL (ref 24–336)

## 2017-03-06 LAB — FOLATE: Folate: 11.1 ng/mL (ref >5.9)

## 2017-03-06 MED ORDER — SODIUM CHLORIDE 0.9 % IV SOLN
Freq: Once | INTRAVENOUS | Status: AC
  Administered 2017-03-06: 15:00:00 via INTRAVENOUS
  Filled 2017-03-06: qty 1000

## 2017-03-06 NOTE — Progress Notes (Signed)
Pt in today for new patient visit for polycythemia.

## 2017-03-06 NOTE — Patient Instructions (Signed)

## 2017-03-06 NOTE — Progress Notes (Signed)
Therapeutic phlebotomy performed with 500 cc removed.  Patient tolerated well without any complications.  IVF's infused per order.

## 2017-03-06 NOTE — Progress Notes (Signed)
[facility] day:  03/06/2017  Chief Complaint: Jamie Buchanan is a 41 y.o. male  with polycythemia who is referred in consultation by Dr. Fulton Reek for assessment and management.  HPI:  The patient has a history of alcohol and tobacco use.  He has smoked 2 packs/day for 25 years.  He states that he started to have trouble with his fingers in 03/2016.  He states that he first noted his fingers feeling cold.  Sensation is worse in his middle finger (left > right).  At times, the tips of his fingers get blue.  He describes feeling needles and pins pushing out from his fingers.  He underwent upper extremity venography on 01/16/2017 by Dr. Leotis Pain for ischemia of the left third finger.  He did not have Buerger's disease on angiography.  Recommendations were to continue aspirin 81 mg a day. He has been taking the recommended aspirin x 1 month.  CBC on 06/13/2016 revealed a hematocrit of 48.8, hemoglobin 17.0, MCV 90.9, platelets 319,000, white count 8000 with an [place] of 4930.   CBC on 11/21/2016 revealed a hematocrit of 51.6, hemoglobin 18.1, MCV 93, platelets 252,000, white count 5400 with an [place] of 3070. Differential included 57% segs, 26% lymphs, 11% monocytes, 5% eosinophils and 1% basophils. Creatinine was 0.8. LFTs were normal.  B12 was 248 (low).  CBC on 01/16/2017 revealed a hematocrit of 59.3, hemoglobin 20, MCV 94.8, platelets 283,000, and WBC 12,400.  Symptomatically, patient feels well. He denies physical complaints today. Patient denies any bruising or bleeding. Patient eats well, with no significant weight loss. He notes that he eats meat and green leafy vegetables on a daily basis. There is no known OSAH syndrome. Patient does not use testosterone. Patient has chronic rhinorrhea. He states, "I think it might be because I am allergic to cigarettes". He smokes 2 packs of cigarettes per day.   Patient drinks alcohol daily. He has experienced  withdrawals from alcohol in the past. B12 was found to be low. He was advised to started B12 supplementation, however to date, he has not done so. Patient denies after bath puritis. Patient describes a continued "pins and needles" sensation to the distal phalanx of his third digit on his LEFT hand.   Patient denies familial history of cancers or blood disorders.    Past Medical History:  Diagnosis Date  . Broken femur ([place])    Age 44  . Buerger disease [facility])     Past Surgical History:  Procedure Laterality Date  . Upper Extremity Venography Bilateral 01/16/2017   Performed by Algernon Huxley, MD at [facility] INVASIVE CV LAB    Family History  Problem Relation Age of Onset  . Diabetes Mother   . Heart disease Mother   . Sinusitis Father     Social History:  reports that he has been smoking.  He has a 50.00 pack-year smoking history. he has never used smokeless tobacco. He reports that he drinks alcohol. His drug history is not on file.  Patient is a 2 pack per day smoker for the last 25 years.  Patient drinks 6 beers a day. He denies illegal substance use.  He works in Dance movement psychotherapist and cooling".  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  Allergies:  Allergies  Allergen Reactions  . Penicillin G Rash    Current Medications: Current Outpatient Medications  Medication Sig Dispense Refill  . aspirin EC 81 MG tablet Take 1 tablet (81 mg  total) by mouth daily. 150 tablet 2   No current facility-administered medications for this visit.     Review of Systems:  GENERAL:  Feels "ok".  No fevers, sweats or weight loss. PERFORMANCE STATUS (ECOG):  1 HEENT:  Constant sinus problem (runny nose).  No visual changes, sore throat, mouth sores or tenderness. Lungs: No shortness of breath or cough.  No hemoptysis. Cardiac:  No chest pain, palpitations, orthopnea, or PND. GI:  No nausea, vomiting, diarrhea, constipation, melena or hematochezia. GU:  No urgency, frequency, dysuria, or hematuria. Musculoskeletal:   No back pain.  No joint pain.  No muscle tenderness. Extremities:  Pins and needle sensation 3rd digit from DIP distally (left > right).  No swelling. Skin:  No rashes or skin changes. Neuro:  No headache, numbness or weakness, balance or coordination issues. Endocrine:  No diabetes, thyroid issues, hot flashes or night sweats. Psych:  No mood changes, depression or anxiety. Pain:  No focal pain. Review of systems:  All other systems reviewed and found to be negative.  Physical Exam: Blood pressure 134/88, pulse 71, temperature 97.8 F (36.6 C), temperature source Tympanic, resp. rate 16, weight 144 lb 8 oz (65.5 kg), SpO2 97 %. GENERAL:  Well developed, well nourished, man sitting comfortably in the exam room in no acute distress. MENTAL STATUS:  Alert and oriented to person, place and time. HEAD:  Brown hair.  Normocephalic, atraumatic, face symmetric, no Cushingoid features. EYES:  Blue eyes.  Pupils equal round and reactive to light and accomodation.  No conjunctivitis or scleral icterus. ENT:  Oropharynx clear without lesion.  Tongue normal. Mucous membranes moist.  RESPIRATORY:  Clear to auscultation without rales, wheezes or rhonchi. CARDIOVASCULAR:  Regular rate and rhythm without murmur, rub or gallop. ABDOMEN:  Soft, non-tender, with active bowel sounds, and no hepatosplenomegaly.  No masses. SKIN:  No rashes, ulcers or lesions. EXTREMITIES: Callused fingers.  Little peeling distal 3rd digit.  No edema, no skin discoloration or tenderness.  No palpable cords. LYMPH NODES: No palpable cervical, supraclavicular, axillary or inguinal adenopathy  NEUROLOGICAL: Unremarkable. PSYCH:  Appropriate.   Appointment on 03/06/2017  Component Date Value Ref Range Status  . Sodium 03/06/2017 136  135 - 145 mmol/L Final  . Potassium 03/06/2017 4.7  3.5 - 5.1 mmol/L Final  . Chloride 03/06/2017 99* 101 - 111 mmol/L Final  . CO2 03/06/2017 29  22 - 32 mmol/L Final  . Glucose, Bld 03/06/2017  93  65 - 99 mg/dL Final  . BUN 03/06/2017 12  6 - 20 mg/dL Final  . Creatinine, Ser 03/06/2017 0.89  0.61 - 1.24 mg/dL Final  . Calcium 03/06/2017 9.1  8.9 - 10.3 mg/dL Final  . Total Protein 03/06/2017 7.6  6.5 - 8.1 g/dL Final  . Albumin 03/06/2017 4.1  3.5 - 5.0 g/dL Final  . AST 03/06/2017 23  15 - 41 U/L Final  . ALT 03/06/2017 16* 17 - 63 U/L Final  . Alkaline Phosphatase 03/06/2017 66  38 - 126 U/L Final  . Total Bilirubin 03/06/2017 0.7  0.3 - 1.2 mg/dL Final  . GFR calc non Af Amer 03/06/2017 >60  >60 mL/min Final  . GFR calc Af Amer 03/06/2017 >60  >60 mL/min Final   Comment: (NOTE) The eGFR has been calculated using the CKD EPI equation. This calculation has not been validated in all clinical situations. eGFR's persistently <60 mL/min signify possible Chronic Kidney Disease.   . Anion gap 03/06/2017 8  5 - 15  Final  . Folate 03/06/2017 11.1  >5.9 ng/mL Final  . Iron 03/06/2017 75  45 - 182 ug/dL Final  . TIBC 03/06/2017 391  250 - 450 ug/dL Final  . Saturation Ratios 03/06/2017 19  17.9 - 39.5 % Final  . UIBC 03/06/2017 316  ug/dL Final  . Ferritin 03/06/2017 75  24 - 336 ng/mL Final  . WBC 03/06/2017 6.5  3.8 - 10.6 K/uL Final  . RBC 03/06/2017 6.33* 4.40 - 5.90 MIL/uL Final  . Hemoglobin 03/06/2017 20.5* 13.0 - 18.0 g/dL Final  . HCT 03/06/2017 60.2* 40.0 - 52.0 % Final  . MCV 03/06/2017 95.2  80.0 - 100.0 fL Final  . MCH 03/06/2017 32.3  26.0 - 34.0 pg Final  . MCHC 03/06/2017 34.0  32.0 - 36.0 g/dL Final  . RDW 03/06/2017 13.9  11.5 - 14.5 % Final  . Platelets 03/06/2017 264  150 - 440 K/uL Final  . Neutrophils Relative % 03/06/2017 65  % Final  . Neutro Abs 03/06/2017 4.2  1.4 - 6.5 K/uL Final  . Lymphocytes Relative 03/06/2017 21  % Final  . Lymphs Abs 03/06/2017 1.4  1.0 - 3.6 K/uL Final  . Monocytes Relative 03/06/2017 10  % Final  . Monocytes Absolute 03/06/2017 0.7  0.2 - 1.0 K/uL Final  . Eosinophils Relative 03/06/2017 3  % Final  . Eosinophils Absolute  03/06/2017 0.2  0 - 0.7 K/uL Final  . Basophils Relative 03/06/2017 1  % Final  . Basophils Absolute 03/06/2017 0.1  0 - 0.1 K/uL Final  . Color, Urine 03/06/2017 YELLOW* YELLOW Final  . APPearance 03/06/2017 CLEAR* CLEAR Final  . Specific Gravity, Urine 03/06/2017 1.017  1.005 - 1.030 Final  . pH 03/06/2017 5.0  5.0 - 8.0 Final  . Glucose, UA 03/06/2017 NEGATIVE  NEGATIVE mg/dL Final  . Hgb urine dipstick 03/06/2017 NEGATIVE  NEGATIVE Final  . Bilirubin Urine 03/06/2017 NEGATIVE  NEGATIVE Final  . Ketones, ur 03/06/2017 NEGATIVE  NEGATIVE mg/dL Final  . Protein, ur 03/06/2017 NEGATIVE  NEGATIVE mg/dL Final  . Nitrite 03/06/2017 NEGATIVE  NEGATIVE Final  . Leukocytes, UA 03/06/2017 NEGATIVE  NEGATIVE Final  . RBC / HPF 03/06/2017 0-5  0 - 5 RBC/hpf Final  . WBC, UA 03/06/2017 0-5  0 - 5 WBC/hpf Final  . Bacteria, UA 03/06/2017 NONE SEEN  NONE SEEN Final  . Squamous Epithelial / LPF 03/06/2017 0-5* NONE SEEN Final  . 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  Hemoglobin has been elevated since 05/2016.  He has a 50 pack year smoking history.  He denies testosterone use or sleep apnea.  He is on a baby aspirin.  CBC on 01/16/2017 revealed a hematocrit of 59.3, hemoglobin 20, MCV 94.8, platelets 283,000, and WBC 12,400.  Patient drinks alcohol daily. 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 B12 was 248 (low) on 11/21/2016.  He has not started supplemental B12.  Symptomatically, he notes "pins and needles" sensation to the distal phalanx of his third digit on his LEFT hand.   Plan: 1.  Discuss diagnosis of erythrocytosis (hgb >16.5 in men).  Discuss primary versus secondary causes.  Differential diagnosis of erythrocytosis (myeloproliferative disorders, smoking, sleep apnea, testosterone use, elevated epo levels secondary to exogenous use or tumors).   2.  Discuss B12 deficiency.  Check MMA and folate.  Encourage  oral B12. 3.  Labs today:  CBC with diff,  ferritin, iron studies, carbon monoxide level, epo level, JAK2 with reflex to 12-15, BCR-ABL, ANA with reflex, urinalysis. 4.  Discuss the need for therapeutic small volume phlebotomy. Hemoglobin 20.5 with a hematocrit of 60.2. Will send patient to [facility] for a 500cc phlebotomy.  5.  Discuss smoking and alcohol cessation.  6.  RTC in 2 weeks for MD assessment, review of work-up and discussion regarding direction of therapy.   Honor Loh, NP  03/06/2017, 4:44 PM   I saw and evaluated the patient, participating in the key portions of the service and reviewing pertinent diagnostic studies and records.  I reviewed the nurse practitioner's note and agree with the findings and the plan.  The assessment and plan were discussed with the patient.  Multiple questions were asked by the patient and answered.   Nolon Stalls, MD 03/06/2017,4:44 PM

## 2017-03-07 ENCOUNTER — Telehealth: Payer: Self-pay | Admitting: *Deleted

## 2017-03-07 LAB — ENA+DNA/DS+SJORGEN'S
ENA SM Ab Ser-aCnc: <0.2 AI (ref 0.0–0.9)
Ribonucleic Protein: 0.3 AI (ref 0.0–0.9)
SSA (Ro) (ENA) Antibody, IgG: <0.2 AI (ref 0.0–0.9)
SSB (La) (ENA) Antibody, IgG: 1.2 AI — ABNORMAL HIGH (ref 0.0–0.9)
ds DNA Ab: <1 IU/mL (ref 0–9)

## 2017-03-07 LAB — ERYTHROPOIETIN: ERYTHROPOIETIN: 3.3 m[IU]/mL (ref 2.6–18.5)

## 2017-03-07 LAB — ANA W/REFLEX: Anti Nuclear Antibody(ANA): POSITIVE — AB

## 2017-03-07 LAB — CARBON MONOXIDE, BLOOD (PERFORMED AT REF LAB): CARBON MONOXIDE, BLOOD: 8.8 % — AB (ref 0.0–3.6)

## 2017-03-07 NOTE — Telephone Encounter (Signed)
Called patient to make him aware of his appt per 03/06/17 los. Message was left and also a copy of his schedule was mailed out.

## 2017-03-08 ENCOUNTER — Telehealth: Payer: Self-pay | Admitting: *Deleted

## 2017-03-08 ENCOUNTER — Other Ambulatory Visit: Payer: Self-pay | Admitting: *Deleted

## 2017-03-08 DIAGNOSIS — D751 Secondary polycythemia: Secondary | ICD-10-CM

## 2017-03-08 LAB — METHYLMALONIC ACID, SERUM: Methylmalonic Acid, Quantitative: 280 nmol/L (ref 0–378)

## 2017-03-08 NOTE — Telephone Encounter (Signed)
-----   Message from Rosey BathMelissa C Corcoran, MD sent at 03/08/2017  6:13 AM EST ----- Regarding: Please call patient  See how he is doing after his phlebotomy.  Would check Hgb and possible phlebotomy next week.  Patient sees us again on 12/03.  M

## 2017-03-08 NOTE — Telephone Encounter (Signed)
Called patient and LVM to inquire how he is doing since his phlebotomy.  Also informed him that he should have labs and possible phlebotomy next week.  A scheduler will contact him with date/time. Sent message to scheduler.  Ordered hgb for next week.

## 2017-03-15 LAB — BCR-ABL1 FISH
Cells Analyzed: 200
Cells Counted: 200
PDF: 0

## 2017-03-16 ENCOUNTER — Inpatient Hospital Stay: Payer: BLUE CROSS/BLUE SHIELD

## 2017-03-16 ENCOUNTER — Other Ambulatory Visit: Payer: Self-pay | Admitting: Hematology and Oncology

## 2017-03-16 VITALS — BP 127/83 | HR 76 | Temp 98.1°F | Resp 18

## 2017-03-16 DIAGNOSIS — D751 Secondary polycythemia: Secondary | ICD-10-CM | POA: Diagnosis not present

## 2017-03-16 DIAGNOSIS — E538 Deficiency of other specified B group vitamins: Secondary | ICD-10-CM | POA: Diagnosis not present

## 2017-03-16 DIAGNOSIS — Z79899 Other long term (current) drug therapy: Secondary | ICD-10-CM | POA: Diagnosis not present

## 2017-03-16 DIAGNOSIS — R202 Paresthesia of skin: Secondary | ICD-10-CM | POA: Diagnosis not present

## 2017-03-16 DIAGNOSIS — F17210 Nicotine dependence, cigarettes, uncomplicated: Secondary | ICD-10-CM | POA: Diagnosis not present

## 2017-03-16 DIAGNOSIS — F1010 Alcohol abuse, uncomplicated: Secondary | ICD-10-CM | POA: Diagnosis not present

## 2017-03-16 LAB — JAK2  V617F QUAL. WITH REFLEX TO EXON 12

## 2017-03-16 LAB — JAK2 EXONS 12-15

## 2017-03-16 LAB — HEMOGLOBIN: Hemoglobin: 18 g/dL (ref 13.0–18.0)

## 2017-03-20 ENCOUNTER — Inpatient Hospital Stay: Payer: BLUE CROSS/BLUE SHIELD | Attending: Hematology and Oncology | Admitting: Hematology and Oncology

## 2017-03-20 ENCOUNTER — Telehealth: Payer: Self-pay | Admitting: *Deleted

## 2017-03-20 VITALS — BP 127/75 | HR 74 | Temp 97.2°F | Resp 20 | Wt 146.6 lb

## 2017-03-20 DIAGNOSIS — F17210 Nicotine dependence, cigarettes, uncomplicated: Secondary | ICD-10-CM | POA: Insufficient documentation

## 2017-03-20 DIAGNOSIS — D751 Secondary polycythemia: Secondary | ICD-10-CM

## 2017-03-20 DIAGNOSIS — F17200 Nicotine dependence, unspecified, uncomplicated: Secondary | ICD-10-CM

## 2017-03-20 DIAGNOSIS — Z7982 Long term (current) use of aspirin: Secondary | ICD-10-CM | POA: Diagnosis not present

## 2017-03-20 DIAGNOSIS — R768 Other specified abnormal immunological findings in serum: Secondary | ICD-10-CM

## 2017-03-20 NOTE — Telephone Encounter (Signed)
Called patient to make him aware of his scheduled appt per 03/20/17 los Message was left on v.mail and also an updated schedule will be mailed out.

## 2017-03-20 NOTE — Progress Notes (Signed)
[facility]  Clinic day:  03/20/2017  Chief Complaint: Jamie Buchanan is a 41 y.o. male  with polycythemia who is seen for review of initial work-up and discussion regarding direction of therapy.  HPI:  The patient was last seen in the hematology clinic on 03/06/2017 for initial consultation.  Symptomatically, he noted a "pins and needles" sensation in the distal phalanx of his third digit on his left hand.  Vascular surgery revealed no evidence of Buerger's disease on angiography.  Hemoglobin had been elevated since 05/2016.  He had a 50 pack year smoking history.  He denied testosterone use or sleep apnea.  He was on a baby aspirin.  B12 was low.  He had not started on supplemental B12.  Smoking cessation was discussed.  We discussed initiation of a phlebotomy program during his work-up.  Work-up on 03/06/2017 revealed a hematocrit of 60.2, hemoglobin 20.5, MCV 95.2, platelets x,000, WBC 6500 with an ANC of 4200.  The following studies were normal: JAK2 V617F, JAK2 exon 12-15, BCR-ABL, erythropoietin level (3.3).   MMA was normal (280).  Folate was 11.1.  Ferritin was 75 with an iron saturation of 19% and a TIBC of 391.  Carbon monoxide level was 8.8 (0-3.6%).  ANA was positive with a SSB ([place]) (ENA) antibody of 1.2 (0-0.9).  He underwent phlebotomy:  500 cc on 03/06/2017 and 300 cc on 03/16/2017.  Hemoglobin was 18 on 03/16/2017.  During the interim, patient has been doing well overall.  He continues to note the "pins and needles" sensation in his LEFT hand. He has not experienced any other acute symptoms. He continues to smoke "about 40" cigarettes on a daily basis. He is eating well, with no demonstrated weight loss.     Past Medical History:  Diagnosis Date  . Broken femur ([facility])    Age 46  . Buerger disease [facility])     Past Surgical History:  Procedure Laterality Date  . UPPER EXTREMITY VENOGRAPHY Bilateral 01/16/2017   Procedure: Upper Extremity  Venography;  Surgeon: Annice Needy, MD;  Location: [facility] INVASIVE CV LAB;  Service: Cardiovascular;  Laterality: Bilateral;    Family History  Problem Relation Age of Onset  . Diabetes Mother   . Heart disease Mother   . Sinusitis Father     Social History:  reports that he has been smoking.  He has a 50.00 pack-year smoking history. he has never used smokeless tobacco. He reports that he drinks alcohol. His drug history is not on file.  Patient is a 2 pack per day smoker for the last 25 years.  Patient drinks 6 beers a day. He denies illegal substance use.  He works in Engineer, mining and cooling".  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  Allergies:  Allergies  Allergen Reactions  . Penicillin G Rash    Current Medications: Current Outpatient Medications  Medication Sig Dispense Refill  . aspirin EC 81 MG tablet Take 1 tablet (81 mg total) by mouth daily. 150 tablet 2   No current facility-administered medications for this visit.     Review of Systems:  GENERAL:  Feels "well".  No fevers or sweats. Weight up 2 pounds. PERFORMANCE STATUS (ECOG):  1 HEENT:  Constant sinus problem (runny nose).  No visual changes, sore throat, mouth sores or tenderness. Lungs: No shortness of breath or cough.  No hemoptysis. Cardiac:  No chest pain, palpitations, orthopnea, or PND. GI:  No nausea, vomiting, diarrhea, constipation, melena or hematochezia.  GU:  No urgency, frequency, dysuria, or hematuria. Musculoskeletal:  No back pain.  No joint pain.  No muscle tenderness. Extremities:  Pins and needle sensation 3rd digit from DIP distally (left > right), no change.  No swelling. Skin:  No rashes or skin changes. Neuro:  No headache, numbness or weakness, balance or coordination issues. Endocrine:  No diabetes, thyroid issues, hot flashes or night sweats. Psych:  No mood changes, depression or anxiety. Pain:  No focal pain. Review of systems:  All other systems reviewed and found to be negative.  Physical  Exam: Blood pressure 127/75, pulse 74, temperature (!) 97.2 F (36.2 C), temperature source Tympanic, resp. rate 20, weight 146 lb 9 oz (66.5 kg). GENERAL:  Well developed, well nourished, man sitting comfortably in the exam room in no acute distress. MENTAL STATUS:  Alert and oriented to person, place and time. HEAD:  Brown hair.  Normocephalic, atraumatic, face symmetric, no Cushingoid features. EYES:  Blue eyes.  Pupils equal round and reactive to light and accomodation.  No conjunctivitis or scleral icterus. ENT:  Oropharynx clear without lesion.  Tongue normal. Mucous membranes moist.  RESPIRATORY:  Clear to auscultation without rales, wheezes or rhonchi. CARDIOVASCULAR:  Regular rate and rhythm without murmur, rub or gallop. ABDOMEN:  Soft, non-tender, with active bowel sounds, and no hepatosplenomegaly.  No masses. SKIN:  No rashes, ulcers or lesions. EXTREMITIES: Callused fingers.  Slight peeling distal 3rd digit.  No edema, no skin discoloration or tenderness.  No palpable cords. LYMPH NODES: No palpable cervical, supraclavicular, axillary or inguinal adenopathy  NEUROLOGICAL: Unremarkable. PSYCH:  Appropriate.   No visits with results within 3 Day(s) from this visit.  Latest known visit with results is:  Appointment on 03/16/2017  Component Date Value Ref Range Status  . 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  Hemoglobin has been elevated since 05/2016.  He has a 50 pack year smoking history.  He denies testosterone use or sleep apnea.  He is on a baby aspirin.  CBC on 01/16/2017 revealed a hematocrit of 59.3, hemoglobin 20, MCV 94.8, platelets 283,000, and WBC 12,400.  Work-up on 03/06/2017 revealed a hematocrit of 60.2, hemoglobin 20.5, MCV 95.2, platelets x,000, WBC 6500 with an ANC of 4200.  Normal studies included: JAK2 V617F, JAK2 exon 12-15 and BCR-ABL.  Erythropoietin level was  3.3 (2.6 - 18.5).  Ferritin was 75 with an iron saturation of 19% and a TIBC of 391.  Carbon monoxide level was 8.8 (0-3.6%).  ANA was positive with a SSB ([place]) (ENA) antibody of 1.2 (0-0.9).  He began a phlebotomy program (last 03/16/2017).  Angiography on 01/16/2017 revealed no evidence of Buerger's disease.   Patient drinks alcohol daily. 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 B12 was 248 (low) on 11/21/2016.  MMA was normal (280) on 03/06/2017.  Folate was 11.1.  He has not started supplemental B12.  Symptomatically, he notes "pins and needles" sensation to the distal phalanx of his third digit on his LEFT hand. Patient has no other symptoms.   Plan: 1.  Review work-up.  No current evidence of PV.  Erythropoietin level low normal.  Carbon monoxide elevated secondary to smoking.  Discuss ongoing phlebotomy problem to assess improvement in symptoms.  Encourage smoking cessation. 2.  Discuss positive ANA.  Refer to Dr. Leatha GildingGeorge W. [facility]ernodle (rheumatology) 3.  Discuss low dose chest CT program.  Checked with Julien GirtPerkins, Charity fundraiserN. Patient does not meet eligibility until the age of 40.  4.  Schedule weekly (x 4)  labs (CBC with diff) and therapeutic phlebotomy if hemoglobin is >16. 5.  Discuss smoking and alcohol cessation.  6.  RTC in 1 month for MD assessment, labs (CBC with diff), and +/- phlebotomy.   Quentin MullingBryan Gray, NP  03/20/2017, 2:20 PM   I saw and evaluated the patient, participating in the key portions of the service and reviewing pertinent diagnostic studies and records.  I reviewed the nurse practitioner's note and agree with the findings and the plan.  The assessment and plan were discussed with the patient.  Multiple questions were asked by the patient and answered.   Nelva NayMelissa Corcoran, MD 03/20/2017,2:20 PM

## 2017-03-20 NOTE — Progress Notes (Signed)
Patient offers no complaints today.  Patient is accompanied by his mother today.

## 2017-03-21 ENCOUNTER — Telehealth: Payer: Self-pay | Admitting: *Deleted

## 2017-03-21 NOTE — Telephone Encounter (Signed)
Called patient to make him aware of his appts that were scheduled per 03/20/17 los. Spoke with his mother. All appts were scheduled as RTC weekly x4 lab/ and Therapeutic Phlebotomy if hemoglobinis is >16. And RTC in 1 month for lab/MD assessment and +/- Phlebotomy. Patient's mother stated that the scheduled  Appointments that were scheduled for 03/27/17,04/03/17,04/10/17 and 04/17/17 for Lab/MD and +/- Phlebotomy Would not work for them, Because due to his work schedule he could only come on Friday's. And that she had made MD aware of that. Therefore, Per. Byran, I Cancelled all appt that were already made and Resched. all.

## 2017-03-24 ENCOUNTER — Other Ambulatory Visit: Payer: BLUE CROSS/BLUE SHIELD

## 2017-03-27 ENCOUNTER — Other Ambulatory Visit: Payer: BLUE CROSS/BLUE SHIELD

## 2017-03-31 ENCOUNTER — Inpatient Hospital Stay: Payer: BLUE CROSS/BLUE SHIELD

## 2017-03-31 VITALS — BP 113/72 | HR 72 | Temp 97.9°F | Resp 18 | Wt 147.0 lb

## 2017-03-31 DIAGNOSIS — Z7982 Long term (current) use of aspirin: Secondary | ICD-10-CM | POA: Diagnosis not present

## 2017-03-31 DIAGNOSIS — D751 Secondary polycythemia: Secondary | ICD-10-CM

## 2017-03-31 DIAGNOSIS — F17210 Nicotine dependence, cigarettes, uncomplicated: Secondary | ICD-10-CM | POA: Diagnosis not present

## 2017-03-31 LAB — CBC WITH DIFFERENTIAL/PLATELET
Basophils Absolute: 0 10*3/uL (ref 0–0.1)
Basophils Relative: 1 %
Eosinophils Absolute: 0.2 10*3/uL (ref 0–0.7)
Eosinophils Relative: 3 %
HCT: 53.7 % — ABNORMAL HIGH (ref 40.0–52.0)
Hemoglobin: 18.1 g/dL — ABNORMAL HIGH (ref 13.0–18.0)
Lymphocytes Relative: 24 %
Lymphs Abs: 1.5 10*3/uL (ref 1.0–3.6)
MCH: 32.2 pg (ref 26.0–34.0)
MCHC: 33.8 g/dL (ref 32.0–36.0)
MCV: 95.2 fL (ref 80.0–100.0)
Monocytes Absolute: 0.6 10*3/uL (ref 0.2–1.0)
Monocytes Relative: 10 %
Neutro Abs: 4.1 10*3/uL (ref 1.4–6.5)
Neutrophils Relative %: 64 %
Platelets: 270 10*3/uL (ref 150–440)
RBC: 5.64 MIL/uL (ref 4.40–5.90)
RDW: 14.3 % (ref 11.5–14.5)
WBC: 6.5 10*3/uL (ref 3.8–10.6)

## 2017-04-02 ENCOUNTER — Encounter: Payer: Self-pay | Admitting: Hematology and Oncology

## 2017-04-03 ENCOUNTER — Other Ambulatory Visit: Payer: BLUE CROSS/BLUE SHIELD

## 2017-04-05 ENCOUNTER — Other Ambulatory Visit: Payer: Self-pay | Admitting: Hematology and Oncology

## 2017-04-05 DIAGNOSIS — D751 Secondary polycythemia: Secondary | ICD-10-CM

## 2017-04-07 ENCOUNTER — Inpatient Hospital Stay: Payer: BLUE CROSS/BLUE SHIELD

## 2017-04-10 ENCOUNTER — Other Ambulatory Visit: Payer: BLUE CROSS/BLUE SHIELD

## 2017-04-14 ENCOUNTER — Inpatient Hospital Stay: Payer: BLUE CROSS/BLUE SHIELD

## 2017-04-17 ENCOUNTER — Ambulatory Visit: Payer: BLUE CROSS/BLUE SHIELD | Admitting: Hematology and Oncology

## 2017-04-17 ENCOUNTER — Other Ambulatory Visit: Payer: BLUE CROSS/BLUE SHIELD

## 2017-04-21 ENCOUNTER — Inpatient Hospital Stay: Payer: BLUE CROSS/BLUE SHIELD | Attending: Hematology and Oncology

## 2017-04-21 ENCOUNTER — Inpatient Hospital Stay: Payer: BLUE CROSS/BLUE SHIELD | Admitting: Hematology and Oncology

## 2017-04-21 ENCOUNTER — Other Ambulatory Visit: Payer: Self-pay | Admitting: Hematology and Oncology

## 2017-04-21 ENCOUNTER — Inpatient Hospital Stay: Payer: BLUE CROSS/BLUE SHIELD

## 2017-04-21 DIAGNOSIS — D751 Secondary polycythemia: Secondary | ICD-10-CM

## 2017-04-21 NOTE — Progress Notes (Deleted)
[facility]-  Cancer Center  Clinic day:  04/21/2017  Chief Complaint: Jamie Buchanan is a 42 y.o. male  with secondary polycythemia who is seen for 1 month assessment on a phlebotomy program.  HPI:  The patient was last seen in the hematology clinic on 03/20/2017.  At that time, he noted "pins and needles" sensation to the distal phalanx of his third digit on his LEFT hand.  Work-up was reviewed.  We discuss ongoing phlebotomy problem to assess improvement in symptoms.  Smoking cessation was encouraged.  He underwent phlebotomy:  300 cc on 03/31/2017.  Hemoglobin was 18.1 on 03/31/2017.  He was referred to Dr. Gavin Potters secondary to his elevated ANA.  During the interim,    Past Medical History:  Diagnosis Date  . Broken femur (HCC)    Age 71  . 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 UPPER EXTREMITY VENOGRAPHY Bilateral 01/16/2017   Procedure: Upper Extremity Venography;  Surgeon: Annice Needy, MD;  Location: [facility] INVASIVE CV LAB;  Service: Cardiovascular;  Laterality: Bilateral;    Family History  Problem Relation Age of Onset  . Diabetes Mother   . Heart disease Mother   . Sinusitis Father     Social History:  reports that he has been smoking.  He has a 50.00 pack-year smoking history. he has never used smokeless tobacco. He reports that he drinks alcohol. His drug history is not on file.  Patient is a 2 pack per day smoker for the last 25 years.  Patient drinks 6 beers a day. He denies illegal substance use.  He works in Engineer, mining and cooling".  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  Allergies:  Allergies  Allergen Reactions  . Penicillin G Rash    Current Medications: Current Outpatient Medications  Medication Sig Dispense Refill  . aspirin EC 81 MG tablet Take 1 tablet (81 mg total) by mouth daily. 150 tablet 2   No current facility-administered medications for this visit.     Review of Systems:  GENERAL:  Feels  "well".  No fevers or sweats. Weight up 2 pounds. PERFORMANCE STATUS (ECOG):  1 HEENT:  Constant sinus problem (runny nose).  No visual changes, sore throat, mouth sores or tenderness. Lungs: No shortness of breath or cough.  No hemoptysis. Cardiac:  No chest pain, palpitations, orthopnea, or PND. GI:  No nausea, vomiting, diarrhea, constipation, melena or hematochezia. GU:  No urgency, frequency, dysuria, or hematuria. Musculoskeletal:  No back pain.  No joint pain.  No muscle tenderness. Extremities:  Pins and needle sensation 3rd digit from DIP distally (left > right), no change.  No swelling. Skin:  No rashes or skin changes. Neuro:  No headache, numbness or weakness, balance or coordination issues. Endocrine:  No diabetes, thyroid issues, hot flashes or night sweats. Psych:  No mood changes, depression or anxiety. Pain:  No focal pain. Review of systems:  All other systems reviewed and found to be negative.  Physical Exam: There were no vitals taken for this visit. GENERAL:  Well developed, well nourished, man sitting comfortably in the exam room in no acute distress. MENTAL STATUS:  Alert and oriented to person, place and time. HEAD:  Brown hair.  Normocephalic, atraumatic, face symmetric, no Cushingoid features. EYES:  Blue eyes.  Pupils equal round and reactive to light and accomodation.  No conjunctivitis or scleral icterus. ENT:  Oropharynx clear without lesion.  Tongue normal. Mucous membranes moist.  RESPIRATORY:  Clear to auscultation without rales, wheezes or rhonchi. CARDIOVASCULAR:  Regular rate and rhythm without murmur, rub or gallop. ABDOMEN:  Soft, non-tender, with active bowel sounds, and no hepatosplenomegaly.  No masses. SKIN:  No rashes, ulcers or lesions. EXTREMITIES: Callused fingers.  Slight peeling distal 3rd digit.  No edema, no skin discoloration or tenderness.  No palpable cords. LYMPH NODES: No palpable cervical, supraclavicular, axillary or inguinal  adenopathy  NEUROLOGICAL: Unremarkable. PSYCH:  Appropriate.   No visits with results within 3 Day(s) from this visit.  Latest known visit with results is:  Appointment on 03/31/2017  Component Date Value Ref Range Status  . WBC 03/31/2017 6.5  3.8 - 10.6 K/uL Final  . RBC 03/31/2017 5.64  4.40 - 5.90 MIL/uL Final  . Hemoglobin 03/31/2017 18.1* 13.0 - 18.0 g/dL Final  . HCT 16/10/960412/14/2018 53.7* 40.0 - 52.0 % Final  . MCV 03/31/2017 95.2  80.0 - 100.0 fL Final  . MCH 03/31/2017 32.2  26.0 - 34.0 pg Final  . MCHC 03/31/2017 33.8  32.0 - 36.0 g/dL Final  . RDW 54/09/811912/14/2018 14.3  11.5 - 14.5 % Final  . Platelets 03/31/2017 270  150 - 440 K/uL Final  . Neutrophils Relative % 03/31/2017 64  % Final  . Neutro Abs 03/31/2017 4.1  1.4 - 6.5 K/uL Final  . Lymphocytes Relative 03/31/2017 24  % Final  . Lymphs Abs 03/31/2017 1.5  1.0 - 3.6 K/uL Final  . Monocytes Relative 03/31/2017 10  % Final  . Monocytes Absolute 03/31/2017 0.6  0.2 - 1.0 K/uL Final  . Eosinophils Relative 03/31/2017 3  % Final  . Eosinophils Absolute 03/31/2017 0.2  0 - 0.7 K/uL Final  . Basophils Relative 03/31/2017 1  % Final  . 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  Hemoglobin has been elevated since 05/2016.  He has a 50 pack year smoking history.  He denies testosterone use or sleep apnea.  He is on a baby aspirin.  CBC on 01/16/2017 revealed a hematocrit of 59.3, hemoglobin 20, MCV 94.8, platelets 283,000, and WBC 12,400.  Work-up on 03/06/2017 revealed a hematocrit of 60.2, hemoglobin 20.5, MCV 95.2, platelets x,000, WBC 6500 with an ANC of 4200.  Normal studies included: JAK2 V617F, JAK2 exon 12-15 and BCR-ABL.  Erythropoietin level was 3.3 (2.6 - 18.5).  Ferritin was 75 with an iron saturation of 19% and a TIBC of 391.  Carbon monoxide level was 8.8 (0-3.6%).  ANA was positive with a SSB (La) (ENA) antibody of 1.2  (0-0.9).  He began a phlebotomy program (last 03/16/2017).  Angiography on 01/16/2017 revealed no evidence of Buerger's disease.   Patient drinks alcohol daily. 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 B12 was 248 (low) on 11/21/2016.  MMA was normal (280) on 03/06/2017.  Folate was 11.1.  He has not started supplemental B12.  Symptomatically, he notes "pins and needles" sensation to the distal phalanx of his third digit on his LEFT hand. Patient has no other symptoms.   Plan: 1.  Labs today:  CBC with diff, ferritin.  Review work-up.  No current evidence of PV.  Erythropoietin level low normal.  Carbon monoxide elevated secondary to smoking.  Discuss ongoing phlebotomy problem to assess improvement in symptoms.  Encourage smoking cessation. 2.  Discuss positive ANA.  Refer to Dr. Leatha GildingGeorge W. [facility]ern[facility]le (rheumatology) 3.  Discuss  low dose chest CT program. Checked with Julien Girt, RN. Patient does not meet eligibility until the age of 33.  4.  Schedule weekly (x 4)  labs (CBC with diff) and therapeutic phlebotomy if hemoglobin is >16. 5.  Discuss smoking and alcohol cessation.  6.  RTC in 1 month for MD assessment, labs (CBC with diff), and +/- phlebotomy.   Rosey Bath, MD  04/21/2017, 5:36 AM   I saw and evaluated the patient, participating in the key portions of the service and reviewing pertinent diagnostic studies and records.  I reviewed the nurse practitioner's note and agree with the findings and the plan.  The assessment and plan were discussed with the patient.  Multiple questions were asked by the patient and answered.   Nelva Nay, MD 04/21/2017,5:36 AM

## 2017-04-24 ENCOUNTER — Telehealth: Payer: Self-pay | Admitting: *Deleted

## 2017-04-24 NOTE — Telephone Encounter (Signed)
Sounds good. We can plan on labs and provider assessment in [place] that day (05/03/2017). I am not sure where he is in terms of his labs. He may also need a phlebotomy as well.

## 2017-04-24 NOTE — Telephone Encounter (Signed)
Called patient to inquire if he is okay.  He has failed last 3 appointments.  Patient states he has been working 3 day weeks with his job which is out of town and has been unable to keep appointments.  He is going to be off next Wednesday, January 16th.  He states he can come that day to either location.

## 2017-04-25 NOTE — Telephone Encounter (Signed)
I scheduled him and left a msg. I ask him to call me back and ok it

## 2017-05-03 ENCOUNTER — Ambulatory Visit: Payer: BLUE CROSS/BLUE SHIELD

## 2017-05-03 ENCOUNTER — Inpatient Hospital Stay: Payer: BLUE CROSS/BLUE SHIELD | Attending: Hematology and Oncology | Admitting: Hematology and Oncology

## 2017-05-03 ENCOUNTER — Inpatient Hospital Stay: Payer: BLUE CROSS/BLUE SHIELD

## 2017-05-03 VITALS — BP 166/90 | HR 73 | Temp 98.6°F | Resp 18

## 2017-05-03 VITALS — BP 147/91 | HR 68 | Temp 98.8°F | Resp 20 | Wt 144.2 lb

## 2017-05-03 DIAGNOSIS — D751 Secondary polycythemia: Secondary | ICD-10-CM

## 2017-05-03 DIAGNOSIS — R768 Other specified abnormal immunological findings in serum: Secondary | ICD-10-CM

## 2017-05-03 DIAGNOSIS — Z7982 Long term (current) use of aspirin: Secondary | ICD-10-CM | POA: Diagnosis not present

## 2017-05-03 DIAGNOSIS — Z95828 Presence of other vascular implants and grafts: Secondary | ICD-10-CM

## 2017-05-03 LAB — CBC WITH DIFFERENTIAL/PLATELET
Basophils Absolute: 0 10*3/uL (ref 0–0.1)
Basophils Relative: 0 %
Eosinophils Absolute: 0.1 10*3/uL (ref 0–0.7)
Eosinophils Relative: 2 %
HCT: 53.9 % — ABNORMAL HIGH (ref 40.0–52.0)
Hemoglobin: 18.7 g/dL — ABNORMAL HIGH (ref 13.0–18.0)
Lymphocytes Relative: 19 %
Lymphs Abs: 1.2 10*3/uL (ref 1.0–3.6)
MCH: 32.2 pg (ref 26.0–34.0)
MCHC: 34.8 g/dL (ref 32.0–36.0)
MCV: 92.8 fL (ref 80.0–100.0)
Monocytes Absolute: 0.6 10*3/uL (ref 0.2–1.0)
Monocytes Relative: 10 %
Neutro Abs: 4.2 10*3/uL (ref 1.4–6.5)
Neutrophils Relative %: 69 %
Platelets: 229 10*3/uL (ref 150–440)
RBC: 5.81 MIL/uL (ref 4.40–5.90)
RDW: 14 % (ref 11.5–14.5)
WBC: 6.2 10*3/uL (ref 3.8–10.6)

## 2017-05-03 LAB — FERRITIN: Ferritin: 83 ng/mL (ref 24–336)

## 2017-05-03 MED ORDER — SODIUM CHLORIDE 0.9% FLUSH
10.0000 mL | INTRAVENOUS | Status: DC | PRN
  Filled 2017-05-03: qty 10

## 2017-05-03 MED ORDER — HEPARIN SOD (PORK) LOCK FLUSH 100 UNIT/ML IV SOLN: 500.0000 [IU] | Freq: Once | INTRAVENOUS | Status: DC

## 2017-05-03 NOTE — Progress Notes (Signed)
[facility]-  Cancer Center  Clinic day:  05/03/2017  Chief Complaint: Jamie Buchanan is a 42 y.o. male  with secondary polycythemia who is seen for 1 month assessment on a phlebotomy program.  HPI:  The patient was last seen in the hematology clinic on 03/20/2017.  At that time, he noted "pins and needles" sensation to the distal phalanx of his third digit on his LEFT hand.  Work-up was reviewed.  We discuss ongoing phlebotomy problem to assess improvement in symptoms.  Smoking cessation was encouraged.  He underwent phlebotomy:  300 cc on 03/31/2017.  Hemoglobin was 18.1 on 03/31/2017.  He was referred to Dr. Gavin Potters secondary to his elevated ANA. He has not yet been seen in consult.   During the interim, patient is doing "ok". He continues to have numbness and tinging in his hands. He notes that the sensation is worse with cold temperatures. The distal phalanx of the third digit on his LEFT hand has been more painful as of late. Patient continues to smoke cigarettes (1 1/2 ppd) and consume alcohol on a daily basis.   Patient denies chest pain and shortness of breath. He denies pain in the clinic today. He is eating well. Weight has decreased by 2 pounds.    Past Medical History:  Diagnosis Date  . Broken femur (HCC)    Age 55  . 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 UPPER EXTREMITY VENOGRAPHY Bilateral 01/16/2017   Procedure: Upper Extremity Venography;  Surgeon: Annice Needy, MD;  Location: [facility] INVASIVE CV LAB;  Service: Cardiovascular;  Laterality: Bilateral;    Family History  Problem Relation Age of Onset  . Diabetes Mother   . Heart disease Mother   . Sinusitis Father     Social History:  reports that he has been smoking.  He has a 50.00 pack-year smoking history. he has never used smokeless tobacco. He reports that he drinks alcohol. His drug history is not on file.  Patient is a 2 pack per day smoker for the  last 25 years.  Patient drinks 6 beers a day. He denies illegal substance use.  He works in Engineer, mining and cooling".  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  Allergies:  Allergies  Allergen Reactions  . Penicillin G Rash    Current Medications: Current Outpatient Medications  Medication Sig Dispense Refill  . aspirin EC 81 MG tablet Take 1 tablet (81 mg total) by mouth daily. 150 tablet 2   No current facility-administered medications for this visit.    Facility-Administered Medications Ordered in Other Visits  Medication Dose Route Frequency Provider Last Rate Last Dose  . heparin lock flush 100 unit/mL  500 Units Intravenous Once Corcoran, Melissa C, MD      . sodium chloride flush (NS) 0.9 % injection 10 mL  10 mL Intravenous PRN Rosey Bath, MD        Review of Systems:  GENERAL:  Feels "well".  No fevers or sweats. Weight down 2 pounds. PERFORMANCE STATUS (ECOG):  1 HEENT:  Constant sinus problem (runny nose).  No visual changes, sore throat, mouth sores or tenderness. Lungs: No shortness of breath or cough.  No hemoptysis. Cardiac:  No chest pain, palpitations, orthopnea, or PND. GI:  No nausea, vomiting, diarrhea, constipation, melena or hematochezia. GU:  No urgency, frequency, dysuria, or hematuria. Musculoskeletal:  No back pain.  No joint pain.  No muscle tenderness. Extremities:  Pins and needle sensation distal phalanx of 3rd digit from DIP (left > right), no change.  No swelling. Skin:  No rashes or skin changes. Neuro:  No headache, numbness or weakness, balance or coordination issues. Endocrine:  No diabetes, thyroid issues, hot flashes or night sweats. Psych:  No mood changes, depression or anxiety. Pain:  No focal pain. Review of systems:  All other systems reviewed and found to be negative.  Physical Exam: Blood pressure (!) 147/91, pulse 68, temperature 98.8 F (37.1 C), temperature source Tympanic, resp. rate 20, weight 144 lb 2.9 oz (65.4 kg). GENERAL:  Well  developed, well nourished, man sitting comfortably in the exam room in no acute distress. MENTAL STATUS:  Alert and oriented to person, place and time. HEAD:  Wearing a cap.  Glasses propped up.  Dark hair with graying.  Normocephalic, atraumatic, face symmetric, no Cushingoid features. EYES:  Blue eyes.  Pupils equal round and reactive to light and accomodation.  No conjunctivitis or scleral icterus. ENT:  Oropharynx clear without lesion.  Tongue normal. Mucous membranes moist.  RESPIRATORY:  Clear to auscultation without rales, wheezes or rhonchi. CARDIOVASCULAR:  Regular rate and rhythm without murmur, rub or gallop. ABDOMEN:  Soft, non-tender, with active bowel sounds, and no hepatosplenomegaly.  No masses. SKIN:  No rashes, ulcers or lesions. EXTREMITIES: Callused fingers.  Slight peeling distal 3rd digit (no change).  No edema, no skin discoloration or tenderness.  No palpable cords. LYMPH NODES: No palpable cervical, supraclavicular, axillary or inguinal adenopathy  NEUROLOGICAL: Unremarkable. PSYCH:  Appropriate.   Infusion on 05/03/2017  Component Date Value Ref Range Status  . WBC 05/03/2017 6.2  3.8 - 10.6 K/uL Final  . RBC 05/03/2017 5.81  4.40 - 5.90 MIL/uL Final  . Hemoglobin 05/03/2017 18.7* 13.0 - 18.0 g/dL Final  . HCT 16/10/960401/16/2019 53.9* 40.0 - 52.0 % Final  . MCV 05/03/2017 92.8  80.0 - 100.0 fL Final  . MCH 05/03/2017 32.2  26.0 - 34.0 pg Final  . MCHC 05/03/2017 34.8  32.0 - 36.0 g/dL Final  . RDW 54/09/811901/16/2019 14.0  11.5 - 14.5 % Final  . Platelets 05/03/2017 229  150 - 440 K/uL Final  . Neutrophils Relative % 05/03/2017 69  % Final  . Neutro Abs 05/03/2017 4.2  1.4 - 6.5 K/uL Final  . Lymphocytes Relative 05/03/2017 19  % Final  . Lymphs Abs 05/03/2017 1.2  1.0 - 3.6 K/uL Final  . Monocytes Relative 05/03/2017 10  % Final  . Monocytes Absolute 05/03/2017 0.6  0.2 - 1.0 K/uL Final  . Eosinophils Relative 05/03/2017 2  % Final  . Eosinophils Absolute 05/03/2017 0.1  0  - 0.7 K/uL Final  . Basophils Relative 05/03/2017 0  % Final  . 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  Hemoglobin has been elevated since 05/2016.  He has a 50 pack year smoking history.  He denies testosterone use or sleep apnea.  He is on a baby aspirin.  CBC on 01/16/2017 revealed a hematocrit of 59.3, hemoglobin 20, MCV 94.8, platelets 283,000, and WBC 12,400.  Work-up on 03/06/2017 revealed a hematocrit of 60.2, hemoglobin 20.5, MCV 95.2, platelets x,000, WBC 6500 with an ANC of 4200.  Normal studies included: JAK2 V617F, JAK2 exon 12-15 and BCR-ABL.  Erythropoietin level was 3.3 (  2.6 - 18.5).  Ferritin was 75 with an iron saturation of 19% and a TIBC of 391.  Carbon monoxide level was 8.8 (0-3.6%).  ANA was positive with a SSB (La) (ENA) antibody of 1.2 (0-0.9).  He began a phlebotomy program (last 03/31/2017).  He undergoes small volume phlebotomy (300 cc) if hemoglobin is > 16.  Angiography on 01/16/2017 revealed no evidence of Buerger's disease.   Patient drinks alcohol daily. 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 B12 was 248 (low) on 11/21/2016.  MMA was normal (280) on 03/06/2017.  Folate was 11.1.  He has not started supplemental B12.  Symptomatically, he notes "pins and needles" sensation to the distal phalanx of his third digit on his LEFT hand. Patient has no other symptoms. Exam is stable. Hemoglobin 18.7 and hematocrit 53.9.  Plan: 1.  Labs today:  CBC with diff, ferritin, epo  2.  Review blood counts. Hemoglobin 18.7 and hematocrit 53.9. Will proceed with a small volume phlebotomy today. 3.  Consider lowering hemoglobin threshold for phlebotomy to assess any improvement in distal phalanx pain. 4.  Discuss positive ANA.  Patient has not been seen by rheumatology. Reschedule  appointment with Dr. Leatha Gilding (rheumatology) 5.  Discuss secondary erythrocytosis likely related to his smoking. Discuss smoking cessation/reduction strategies.  6.  Discuss alcohol cessation. 7.  RTC in 6 weeks for MD assessment, labs (CBC with diff), and +/- phlebotomy   Quentin Mulling, NP  05/03/2017, 10:44 AM   I saw and evaluated the patient, participating in the key portions of the service and reviewing pertinent diagnostic studies and records.  I reviewed the nurse practitioner's note and agree with the findings and the plan.  The assessment and plan were discussed with the patient.  A few questions were asked by the patient and answered.   Nelva Nay, MD 05/03/2017,10:44 AM

## 2017-05-03 NOTE — Progress Notes (Signed)
Patient offers no complaints today. 

## 2017-05-03 NOTE — Patient Instructions (Signed)

## 2017-05-04 LAB — ERYTHROPOIETIN: ERYTHROPOIETIN: 2.7 m[IU]/mL (ref 2.6–18.5)

## 2017-05-20 ENCOUNTER — Encounter: Payer: Self-pay | Admitting: Hematology and Oncology

## 2017-05-22 ENCOUNTER — Telehealth: Payer: Self-pay | Admitting: *Deleted

## 2017-05-22 NOTE — Telephone Encounter (Signed)
Called patient's home and spoke with his mother.  She states the patient is out of town and will be back Friday,  Will try to reach him on Friday.

## 2017-05-22 NOTE — Telephone Encounter (Signed)
-----   Message from Rosey BathMelissa C Corcoran, MD sent at 05/20/2017  7:09 AM EST ----- Regarding: Please call patient  Consider more frequent lab checks and phlebotomies as hemoglobin has not decreased with current frequency of checks.  Consider every other week if his work schedule allows.  M

## 2017-06-15 NOTE — Progress Notes (Signed)
[facility]-  Cancer Center  Clinic day:  06/16/2017  Chief Complaint: Jamie Buchanan is a 42 y.o. male  with secondary polycythemia who is seen for 6 week assessment on a phlebotomy program.  HPI:  The patient was last seen in the hematology clinic on 05/03/2017.  At that time, patient was doing "okay".  He continued to note numbness and tingling in his hands that is exacerbated by cold temperatures.The distal phalanx of the third digit of his LEFT hand had been more painful.  He continued to smoke and drink alcohol on a daily basis despite cessation education.  Exam was stable.  Hemoglobin was 18.7, hematocrit 53.9, and platelets 229,000.  Ferritin was 83.  Erythropoietin level was normal at 2.7.  Patient underwent a small volume therapeutic phlebotomy.  He was contacted on 05/22/2017 to discuss consideration of more frequent lab checks and phlebotomies.  Of note patient's hemoglobin had not decreased with current intervention schedule.  Message was left for patient to return to call to discuss, however he never returned the call.  He was referred to Dr. Gavin Potters secondary to his elevated ANA.  He has not yet been seen in consult.   During the interim, patient has been doing "alright I reckon". Patient notes that his LEFT middle finger is worse than it has been in the past. Patient continues to smoke. He has cut back to 1 pack a day. Tomorrow is his birthday and patient plans to quit smoking tomorrow. He does not appreciate any improvement following his previous phlebotomies.   Patient has not experienced any episodes of chest pain or shortness of breath. He is eating well. Weight is down 5 pounds. He continues to drink alcohol daily.  Pain is rated 8/10 today.    Past Medical History:  Diagnosis Date  . Broken femur (HCC)    Age 83  . 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 UPPER EXTREMITY VENOGRAPHY Bilateral 01/16/2017   Procedure: Upper Extremity Venography;  Surgeon: Annice Needy, MD;  Location: [facility] INVASIVE CV LAB;  Service: Cardiovascular;  Laterality: Bilateral;    Family History  Problem Relation Age of Onset  . Diabetes Mother   . Heart disease Mother   . Sinusitis Father     Social History:  reports that he has been smoking.  He has a 50.00 pack-year smoking history. he has never used smokeless tobacco. He reports that he drinks alcohol. His drug history is not on file.  Patient is a 2 pack per day smoker for the last 25 years.  Patient drinks 6 beers a day. He denies illegal substance use.  He works in Engineer, mining and cooling".  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  Allergies:  Allergies  Allergen Reactions  . Penicillin G Rash    Current Medications: Current Outpatient Medications  Medication Sig Dispense Refill  . aspirin EC 81 MG tablet Take 1 tablet (81 mg total) by mouth daily. 150 tablet 2   No current facility-administered medications for this visit.     Review of Systems:  GENERAL:  Feels "alright, I reckon".  No fevers or sweats. Weight down 5 pounds. PERFORMANCE STATUS (ECOG):  1 HEENT:  Constant sinus problem (runny nose).  No visual changes, sore throat, mouth sores or tenderness. Lungs: No shortness of breath or cough.  No hemoptysis. Cardiac:  No chest pain, palpitations, orthopnea, or PND. GI:  No nausea, vomiting, diarrhea, constipation, melena or  hematochezia. GU:  No urgency, frequency, dysuria, or hematuria. Musculoskeletal:  No back pain.  No joint pain.  No muscle tenderness. Extremities:  Pins and needle sensation distal phalanx of 3rd digit from DIP (left > right), worse.  No swelling. Skin:  No rashes or skin changes. Neuro:  No headache, numbness or weakness, balance or coordination issues. Endocrine:  No diabetes, thyroid issues, hot flashes or night sweats. Psych:  No mood changes, depression or anxiety. Pain:  8/10 - generalized. Review of systems:  All other systems  reviewed and found to be negative.  Physical Exam: Blood pressure 126/88, pulse 84, temperature 98.8 F (37.1 C), temperature source Tympanic, resp. rate 20, weight 139 lb 4 oz (63.2 kg), SpO2 98 %. GENERAL:  Well developed, well nourished, man sitting comfortably in the exam room in no acute distress. MENTAL STATUS:  Alert and oriented to person, place and time. HEAD:  Wearing a cap.  Short brown hair with graying.  Normocephalic, atraumatic, face symmetric, no Cushingoid features. EYES:  Blue eyes.  Pupils equal round and reactive to light and accomodation.  No conjunctivitis or scleral icterus. ENT:  Oropharynx clear without lesion.  Tongue normal. Mucous membranes moist.  RESPIRATORY:  Clear to auscultation without rales, wheezes or rhonchi. CARDIOVASCULAR:  Regular rate and rhythm without murmur, rub or gallop. ABDOMEN:  Soft, non-tender, with active bowel sounds, and no hepatosplenomegaly.  No masses. SKIN:  No rashes, ulcers or lesions. EXTREMITIES: Callused fingers.  Slight peeling distal 3rd digit (no change).  No edema, no skin discoloration or tenderness.  No palpable cords. LYMPH NODES: No palpable cervical, supraclavicular, axillary or inguinal adenopathy  NEUROLOGICAL: Unremarkable. PSYCH:  Appropriate.   Appointment on 06/16/2017  Component Date Value Ref Range Status  . WBC 06/16/2017 4.4  3.8 - 10.6 K/uL Final  . RBC 06/16/2017 5.03  4.40 - 5.90 MIL/uL Final  . Hemoglobin 06/16/2017 16.3  13.0 - 18.0 g/dL Final  . HCT 16/01/9603 46.5  40.0 - 52.0 % Final  . MCV 06/16/2017 92.4  80.0 - 100.0 fL Final  . MCH 06/16/2017 32.4  26.0 - 34.0 pg Final  . MCHC 06/16/2017 35.1  32.0 - 36.0 g/dL Final  . RDW 54/12/8117 14.0  11.5 - 14.5 % Final  . Platelets 06/16/2017 281  150 - 440 K/uL Final  . Neutrophils Relative % 06/16/2017 48  % Final  . Neutro Abs 06/16/2017 2.1  1.4 - 6.5 K/uL Final  . Lymphocytes Relative 06/16/2017 34  % Final  . Lymphs Abs 06/16/2017 1.5  1.0 - 3.6  K/uL Final  . Monocytes Relative 06/16/2017 13  % Final  . Monocytes Absolute 06/16/2017 0.6  0.2 - 1.0 K/uL Final  . Eosinophils Relative 06/16/2017 4  % Final  . Eosinophils Absolute 06/16/2017 0.2  0 - 0.7 K/uL Final  . Basophils Relative 06/16/2017 1  % Final  . 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  Hemoglobin has been elevated since 05/2016.  He has a 50 pack year smoking history.  He denies testosterone use or sleep apnea.  He is on a baby aspirin.  CBC on 01/16/2017 revealed a hematocrit of 59.3, hemoglobin 20, MCV 94.8, platelets 283,000, and WBC 12,400.  Work-up on 03/06/2017 revealed a hematocrit of 60.2, hemoglobin 20.5, MCV 95.2, platelets 264,000,  WBC 6500 with an ANC of 4200.  Normal studies included: JAK2 V617F, JAK2 exon 12-15 and BCR-ABL.  Erythropoietin level was 3.3 (2.6 - 18.5).  Ferritin was 75 with an iron saturation of 19% and a TIBC of 391.  Carbon monoxide level was 8.8 (0-3.6%).  ANA was positive with a SSB (La) (ENA) antibody of 1.2 (0-0.9).  He began a phlebotomy program (last 03/31/2017).  He undergoes small volume phlebotomy (300 cc) if hemoglobin is > 16.  Angiography on 01/16/2017 revealed no evidence of Buerger's disease.   Patient drinks alcohol daily. 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 B12 was 248 (low) on 11/21/2016.  MMA was normal (280) on 03/06/2017.  Folate was 11.1.  He has not started supplemental B12.  Symptomatically, he notes "pins and needles" sensation to the distal phalanx of his third digit on his LEFT hand, worse. Patient has no other symptoms. He continues to smoke, but plans on quitting after his birthday.  Exam is stable. Hemoglobin 16.3 and hematocrit 46.5.  Plan: 1.  Labs today:  CBC with diff 2.  Review blood counts. Hemoglobin 16.3 and  hematocrit 46.5. Will proceed with a small volume phlebotomy today. 3.  Discuss increasing frequency of lab checks and subsequent phlebotomies, as patient is not improving with current parameters.  Consider lowering hemoglobin threshold for phlebotomy to assess for any improvement in distal phalanx pain. Patient in agreement. Will decrease goal hematocrit to < 45 and hemoglobin < 15. 4.  Discuss positive ANA.  Patient has not been seen by rheumatology. Reschedule appointment with Dr. Leatha GildingGeorge W. Kernodle (rheumatology) 5.  Discuss secondary erythrocytosis likely related to his smoking. Discuss smoking cessation/reduction strategies.  6.  Discuss alcohol and smoking cessation. 7.  Continue daily ASA 81 mg.  8.  RTC monthly for labs (CBC with diff) and +/- phlebotomy.  9.  RTC in 3 months for MD assessment, labs (CBC with diff), and +/- phlebotomy   Quentin MullingBryan Gray, NP  06/16/2017, 2:48 PM   I saw and evaluated the patient, participating in the key portions of the service and reviewing pertinent diagnostic studies and records.  I reviewed the nurse practitioner's note and agree with the findings and the plan.  The assessment and plan were discussed with the patient.  A few questions were asked by the patient and answered.   Nelva NayMelissa Corcoran, MD 06/16/2017,2:48 PM

## 2017-06-16 ENCOUNTER — Inpatient Hospital Stay: Payer: BLUE CROSS/BLUE SHIELD | Attending: Hematology and Oncology

## 2017-06-16 ENCOUNTER — Encounter: Payer: Self-pay | Admitting: Hematology and Oncology

## 2017-06-16 ENCOUNTER — Inpatient Hospital Stay (HOSPITAL_BASED_OUTPATIENT_CLINIC_OR_DEPARTMENT_OTHER): Payer: BLUE CROSS/BLUE SHIELD | Admitting: Hematology and Oncology

## 2017-06-16 ENCOUNTER — Other Ambulatory Visit: Payer: Self-pay | Admitting: *Deleted

## 2017-06-16 ENCOUNTER — Inpatient Hospital Stay: Payer: BLUE CROSS/BLUE SHIELD

## 2017-06-16 VITALS — BP 120/86 | HR 90 | Resp 20

## 2017-06-16 VITALS — BP 126/88 | HR 84 | Temp 98.8°F | Resp 20 | Wt 139.2 lb

## 2017-06-16 DIAGNOSIS — F17210 Nicotine dependence, cigarettes, uncomplicated: Secondary | ICD-10-CM | POA: Diagnosis not present

## 2017-06-16 DIAGNOSIS — D751 Secondary polycythemia: Secondary | ICD-10-CM

## 2017-06-16 DIAGNOSIS — R768 Other specified abnormal immunological findings in serum: Secondary | ICD-10-CM

## 2017-06-16 DIAGNOSIS — R200 Anesthesia of skin: Secondary | ICD-10-CM | POA: Diagnosis not present

## 2017-06-16 LAB — CBC WITH DIFFERENTIAL/PLATELET
Basophils Absolute: 0 10*3/uL (ref 0–0.1)
Basophils Relative: 1 %
Eosinophils Absolute: 0.2 10*3/uL (ref 0–0.7)
Eosinophils Relative: 4 %
HCT: 46.5 % (ref 40.0–52.0)
Hemoglobin: 16.3 g/dL (ref 13.0–18.0)
Lymphocytes Relative: 34 %
Lymphs Abs: 1.5 10*3/uL (ref 1.0–3.6)
MCH: 32.4 pg (ref 26.0–34.0)
MCHC: 35.1 g/dL (ref 32.0–36.0)
MCV: 92.4 fL (ref 80.0–100.0)
Monocytes Absolute: 0.6 10*3/uL (ref 0.2–1.0)
Monocytes Relative: 13 %
Neutro Abs: 2.1 10*3/uL (ref 1.4–6.5)
Neutrophils Relative %: 48 %
Platelets: 281 10*3/uL (ref 150–440)
RBC: 5.03 MIL/uL (ref 4.40–5.90)
RDW: 14 % (ref 11.5–14.5)
WBC: 4.4 10*3/uL (ref 3.8–10.6)

## 2017-06-16 NOTE — Progress Notes (Signed)
Patient continues to have pain and swelling in bilateral middle fingers.  Otherwise, offers no complaints.

## 2017-06-19 ENCOUNTER — Telehealth: Payer: Self-pay | Admitting: *Deleted

## 2017-06-19 NOTE — Telephone Encounter (Signed)
Called Dr. Benjamine SpragueGeorge Kernodle's office to schedule patient to be  Seen for (Rheumatology) per 06/16/17 los. I left another message on his vmail. Office of Dr. Gavin PottersKernodle  stated that they also Tried to reach out to him in 12/18 and in 2/19 and left messages,  However, he has not returned any of their phone calls.

## 2017-06-19 NOTE — Telephone Encounter (Signed)
Called patient to inquire if he has made an appointment to see Dr. Saverio DankerWallace Kernodle, Rheumatologist.  Patient states he has not.  Encouraged him to do so.  He states he will call.

## 2017-06-19 NOTE — Telephone Encounter (Signed)
-----   Message from Rosey BathMelissa C Corcoran, MD sent at 06/19/2017  9:20 AM EST ----- Regarding: RE: Schedule Rheumatology appt  Please call patient and encourage to make appt with rheumatology.  M  ----- Message ----- From: Rennie Natterobb, Lakisha R, NT Sent: 06/19/2017   9:01 AM To: Rennie NatterLakisha R Cobb, NT, Verlee MonteBryan E Gray, NP, # Subject: Schedule Rheumatology appt                     Called Dr. Benjamine SpragueGeorge Buchanan's office to schedule patient to be  Seen for (Rheumatology) per 06/16/17 los. I left another message on his vmail. Office of Dr. Gavin PottersKernodle  stated that they also Tried to reach out to him in 12/18 and in 2/19 and left messages,  However, he has not returned any of their phone calls.  Thanks, [employer]ner[employer]akisha

## 2017-06-30 DIAGNOSIS — R768 Other specified abnormal immunological findings in serum: Secondary | ICD-10-CM | POA: Diagnosis not present

## 2017-06-30 DIAGNOSIS — I731 Thromboangiitis obliterans [Buerger's disease]: Secondary | ICD-10-CM | POA: Diagnosis not present

## 2017-06-30 DIAGNOSIS — Z720 Tobacco use: Secondary | ICD-10-CM | POA: Diagnosis not present

## 2017-06-30 DIAGNOSIS — D751 Secondary polycythemia: Secondary | ICD-10-CM | POA: Diagnosis not present

## 2017-07-14 ENCOUNTER — Telehealth: Payer: Self-pay | Admitting: *Deleted

## 2017-07-14 NOTE — Telephone Encounter (Signed)
RTC monthly for labs (CBC with diff) and +/- phlebotomy. per 06/16/17 los.  Patient called and left a VM that his appt is on a Monday 07/17/17, he stated he works  [employer] out of town and prefers Friday's. His Monday both 07/17/17 & 08/21/17  appts were  Change to Friday per patient's request.Message was left on his vmail. Also A letter will be mailed out today.

## 2017-07-17 ENCOUNTER — Inpatient Hospital Stay: Payer: BLUE CROSS/BLUE SHIELD

## 2017-07-21 ENCOUNTER — Inpatient Hospital Stay: Payer: BLUE CROSS/BLUE SHIELD | Attending: Hematology and Oncology

## 2017-07-21 ENCOUNTER — Inpatient Hospital Stay: Payer: BLUE CROSS/BLUE SHIELD

## 2017-07-21 DIAGNOSIS — D751 Secondary polycythemia: Secondary | ICD-10-CM

## 2017-07-21 LAB — CBC WITH DIFFERENTIAL/PLATELET
BASOS PCT: 1 %
Basophils Absolute: 0 10*3/uL (ref 0–0.1)
EOS ABS: 0.2 10*3/uL (ref 0–0.7)
EOS PCT: 5 %
HCT: 47.7 % (ref 40.0–52.0)
Hemoglobin: 16.7 g/dL (ref 13.0–18.0)
LYMPHS ABS: 1.4 10*3/uL (ref 1.0–3.6)
Lymphocytes Relative: 31 %
MCH: 32.5 pg (ref 26.0–34.0)
MCHC: 35 g/dL (ref 32.0–36.0)
MCV: 92.9 fL (ref 80.0–100.0)
MONO ABS: 0.7 10*3/uL (ref 0.2–1.0)
MONOS PCT: 15 %
Neutro Abs: 2.3 10*3/uL (ref 1.4–6.5)
Neutrophils Relative %: 48 %
Platelets: 319 10*3/uL (ref 150–440)
RBC: 5.13 MIL/uL (ref 4.40–5.90)
RDW: 14.1 % (ref 11.5–14.5)
WBC: 4.7 10*3/uL (ref 3.8–10.6)

## 2017-07-28 ENCOUNTER — Other Ambulatory Visit: Payer: BLUE CROSS/BLUE SHIELD

## 2017-08-21 ENCOUNTER — Other Ambulatory Visit: Payer: BLUE CROSS/BLUE SHIELD

## 2017-08-25 ENCOUNTER — Inpatient Hospital Stay: Payer: BLUE CROSS/BLUE SHIELD

## 2017-08-25 ENCOUNTER — Telehealth: Payer: Self-pay | Admitting: *Deleted

## 2017-08-25 NOTE — Telephone Encounter (Signed)
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  I spoke with his mother Nettie Elm she said that she would try to contact him an have him call office back to R/S I provided her with our office number. She stated that by him working out of town he never knows if he'll be in town or not.

## 2017-09-18 ENCOUNTER — Other Ambulatory Visit: Payer: BLUE CROSS/BLUE SHIELD

## 2017-09-18 ENCOUNTER — Ambulatory Visit: Payer: BLUE CROSS/BLUE SHIELD | Admitting: Hematology and Oncology

## 2017-09-29 ENCOUNTER — Other Ambulatory Visit: Payer: BLUE CROSS/BLUE SHIELD

## 2017-09-29 ENCOUNTER — Ambulatory Visit: Payer: BLUE CROSS/BLUE SHIELD | Admitting: Hematology and Oncology

## 2017-10-13 ENCOUNTER — Inpatient Hospital Stay: Payer: BLUE CROSS/BLUE SHIELD | Admitting: Hematology and Oncology

## 2017-10-13 ENCOUNTER — Inpatient Hospital Stay: Payer: BLUE CROSS/BLUE SHIELD

## 2017-10-13 NOTE — Progress Notes (Deleted)
[facility]-  Cancer Center  Clinic day:  10/13/2017  Chief Complaint: Jamie Buchanan is a 42 y.o. male  with secondary polycythemia who is seen for 3 month assessment on a phlebotomy program.  HPI:  The patient was last seen in the hematology clinic on 06/16/2017.  At that time, he noted "pins and needles" sensation to the distal phalanx of his third digit on his LEFT hand, worse. Patient had no other symptoms. He continued to smoke, but plans on quitting after his birthday.  Exam was stable. Hemoglobin was 16.3 and hematocrit 46.5.  He had a phlebotomy.  He underwent phlebotomy on 07/21/2017.  Hematocrit was 47.7 and hemoglobin 16.7.  During the interim,   Past Medical History:  Diagnosis Date  . Broken femur (HCC)    Age 29  . 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 UPPER EXTREMITY VENOGRAPHY Bilateral 01/16/2017   Procedure: Upper Extremity Venography;  Surgeon: Annice Needy, MD;  Location: [facility] INVASIVE CV LAB;  Service: Cardiovascular;  Laterality: Bilateral;    Family History  Problem Relation Age of Onset  . Diabetes Mother   . Heart disease Mother   . Sinusitis Father     Social History:  reports that he has been smoking.  He has a 50.00 pack-year smoking history. He has never used smokeless tobacco. He reports that he drinks alcohol. His drug history is not on file.  Patient is a 2 pack per day smoker for the last 25 years.  Patient drinks 6 beers a day. He denies illegal substance use.  He works in Engineer, mining and cooling".  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  Allergies:  Allergies  Allergen Reactions  . Penicillin G Rash    Current Medications: Current Outpatient Medications  Medication Sig Dispense Refill  . aspirin EC 81 MG tablet Take 1 tablet (81 mg total) by mouth daily. 150 tablet 2   No current facility-administered medications for this visit.     Review of Systems:  GENERAL:  Feels "alright, I  reckon".  No fevers or sweats. Weight down 5 pounds. PERFORMANCE STATUS (ECOG):  1 HEENT:  Constant sinus problem (runny nose).  No visual changes, sore throat, mouth sores or tenderness. Lungs: No shortness of breath or cough.  No hemoptysis. Cardiac:  No chest pain, palpitations, orthopnea, or PND. GI:  No nausea, vomiting, diarrhea, constipation, melena or hematochezia. GU:  No urgency, frequency, dysuria, or hematuria. Musculoskeletal:  No back pain.  No joint pain.  No muscle tenderness. Extremities:  Pins and needle sensation distal phalanx of 3rd digit from DIP (left > right), worse.  No swelling. Skin:  No rashes or skin changes. Neuro:  No headache, numbness or weakness, balance or coordination issues. Endocrine:  No diabetes, thyroid issues, hot flashes or night sweats. Psych:  No mood changes, depression or anxiety. Pain:  8/10 - generalized. Review of systems:  All other systems reviewed and found to be negative.  Physical Exam: There were no vitals taken for this visit. GENERAL:  Well developed, well nourished, man sitting comfortably in the exam room in no acute distress. MENTAL STATUS:  Alert and oriented to person, place and time. HEAD:  Wearing a cap.  Short brown hair with graying.  Normocephalic, atraumatic, face symmetric, no Cushingoid features. EYES:  Blue eyes.  Pupils equal round and reactive to light and accomodation.  No conjunctivitis or scleral icterus. ENT:  Oropharynx clear without  lesion.  Tongue normal. Mucous membranes moist.  RESPIRATORY:  Clear to auscultation without rales, wheezes or rhonchi. CARDIOVASCULAR:  Regular rate and rhythm without murmur, rub or gallop. ABDOMEN:  Soft, non-tender, with active bowel sounds, and no hepatosplenomegaly.  No masses. SKIN:  No rashes, ulcers or lesions. EXTREMITIES: Callused fingers.  Slight peeling distal 3rd digit (no change).  No edema, no skin discoloration or tenderness.  No palpable cords. LYMPH NODES: No  palpable cervical, supraclavicular, axillary or inguinal adenopathy  NEUROLOGICAL: Unremarkable. PSYCH:  Appropriate.   No visits with results within 3 Day(s) from this visit.  Latest known visit with results is:  Appointment on 07/21/2017  Component Date Value Ref Range Status  . WBC 07/21/2017 4.7  3.8 - 10.6 K/uL Final  . RBC 07/21/2017 5.13  4.40 - 5.90 MIL/uL Final  . Hemoglobin 07/21/2017 16.7  13.0 - 18.0 g/dL Final  . HCT 40/98/119104/08/2017 47.7  40.0 - 52.0 % Final  . MCV 07/21/2017 92.9  80.0 - 100.0 fL Final  . MCH 07/21/2017 32.5  26.0 - 34.0 pg Final  . MCHC 07/21/2017 35.0  32.0 - 36.0 g/dL Final  . RDW 47/82/956204/08/2017 14.1  11.5 - 14.5 % Final  . Platelets 07/21/2017 319  150 - 440 K/uL Final  . Neutrophils Relative % 07/21/2017 48  % Final  . Neutro Abs 07/21/2017 2.3  1.4 - 6.5 K/uL Final  . Lymphocytes Relative 07/21/2017 31  % Final  . Lymphs Abs 07/21/2017 1.4  1.0 - 3.6 K/uL Final  . Monocytes Relative 07/21/2017 15  % Final  . Monocytes Absolute 07/21/2017 0.7  0.2 - 1.0 K/uL Final  . Eosinophils Relative 07/21/2017 5  % Final  . Eosinophils Absolute 07/21/2017 0.2  0 - 0.7 K/uL Final  . Basophils Relative 07/21/2017 1  % Final  . 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  Hemoglobin has been elevated since 05/2016.  He has a 50 pack year smoking history.  He denies testosterone use or sleep apnea.  He is on a baby aspirin.  CBC on 01/16/2017 revealed a hematocrit of 59.3, hemoglobin 20, MCV 94.8, platelets 283,000, and WBC 12,400.  Work-up on 03/06/2017 revealed a hematocrit of 60.2, hemoglobin 20.5, MCV 95.2, platelets 264,000, WBC 6500 with an ANC of 4200.  Normal studies included: JAK2 V617F, JAK2 exon 12-15 and BCR-ABL.  Erythropoietin level was 3.3 (2.6 - 18.5).  Ferritin was 75 with an iron  saturation of 19% and a TIBC of 391.  Carbon monoxide level was 8.8 (0-3.6%).  ANA was positive with a SSB (La) (ENA) antibody of 1.2 (0-0.9).  He began a phlebotomy program (last 03/31/2017).  He undergoes small volume phlebotomy (300 cc) if hemoglobin is > 16.  Angiography on 01/16/2017 revealed no evidence of Buerger's disease.   Patient drinks alcohol daily. 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 B12 was 248 (low) on 11/21/2016.  MMA was normal (280) on 03/06/2017.  Folate was 11.1.  He has not started supplemental B12.  Symptomatically, he notes "pins and needles" sensation to the distal phalanx of his third digit on his LEFT hand, worse. Patient has no other symptoms. He continues to smoke, but plans on quitting after his birthday.  Exam is stable. Hemoglobin 16.3 and hematocrit 46.5.  Plan: 1.  Labs today:  CBC with diff.   2.  Review blood counts. Hemoglobin 16.3 and hematocrit 46.5. Will proceed with a small volume phlebotomy today. 3.  Discuss increasing frequency of lab checks and subsequent phlebotomies, as patient is not improving with current parameters.  Consider lowering hemoglobin threshold for phlebotomy to assess for any improvement in distal phalanx pain. Patient in agreement. Will decrease goal hematocrit to < 45 and hemoglobin < 15. 4.  Discuss positive ANA.  Patient has not been seen by rheumatology. Reschedule appointment with Dr. Leatha Gilding (rheumatology) 5.  Discuss secondary erythrocytosis likely related to his smoking. Discuss smoking cessation/reduction strategies.  6.  Discuss alcohol and smoking cessation. 7.  Continue daily ASA 81 mg.  8.  RTC monthly for labs (CBC with diff) and +/- phlebotomy.  9.  RTC in 3 months for MD assessment, labs (CBC with diff), and +/- phlebotomy   Rosey Bath, MD  10/13/2017, 6:41 AM   I saw and evaluated the patient, participating in the key portions of the service and reviewing pertinent  diagnostic studies and records.  I reviewed the nurse practitioner's note and agree with the findings and the plan.  The assessment and plan were discussed with the patient.  A few questions were asked by the patient and answered.   Nelva Nay, MD 10/13/2017,6:41 AM

## 2017-10-24 ENCOUNTER — Other Ambulatory Visit: Payer: Self-pay | Admitting: Hematology and Oncology

## 2017-10-27 ENCOUNTER — Inpatient Hospital Stay: Payer: BLUE CROSS/BLUE SHIELD

## 2017-10-27 ENCOUNTER — Inpatient Hospital Stay: Payer: BLUE CROSS/BLUE SHIELD | Admitting: Urgent Care

## 2017-11-10 ENCOUNTER — Other Ambulatory Visit: Payer: Self-pay

## 2017-11-10 ENCOUNTER — Other Ambulatory Visit: Payer: Self-pay | Admitting: Hematology and Oncology

## 2017-11-10 ENCOUNTER — Inpatient Hospital Stay (HOSPITAL_BASED_OUTPATIENT_CLINIC_OR_DEPARTMENT_OTHER): Payer: BLUE CROSS/BLUE SHIELD | Admitting: Hematology and Oncology

## 2017-11-10 ENCOUNTER — Other Ambulatory Visit: Payer: Self-pay | Admitting: *Deleted

## 2017-11-10 ENCOUNTER — Inpatient Hospital Stay: Payer: BLUE CROSS/BLUE SHIELD | Attending: Hematology and Oncology

## 2017-11-10 ENCOUNTER — Encounter: Payer: Self-pay | Admitting: Hematology and Oncology

## 2017-11-10 ENCOUNTER — Inpatient Hospital Stay: Payer: BLUE CROSS/BLUE SHIELD

## 2017-11-10 VITALS — BP 136/84 | HR 68 | Resp 18

## 2017-11-10 VITALS — BP 133/83 | HR 74 | Temp 96.2°F | Resp 18 | Wt 142.3 lb

## 2017-11-10 DIAGNOSIS — E538 Deficiency of other specified B group vitamins: Secondary | ICD-10-CM

## 2017-11-10 DIAGNOSIS — D751 Secondary polycythemia: Secondary | ICD-10-CM | POA: Diagnosis not present

## 2017-11-10 DIAGNOSIS — F17210 Nicotine dependence, cigarettes, uncomplicated: Secondary | ICD-10-CM | POA: Insufficient documentation

## 2017-11-10 DIAGNOSIS — R200 Anesthesia of skin: Secondary | ICD-10-CM | POA: Insufficient documentation

## 2017-11-10 LAB — CBC WITH DIFFERENTIAL/PLATELET
Basophils Absolute: 0 10*3/uL (ref 0–0.1)
Basophils Relative: 1 %
Eosinophils Absolute: 0.1 10*3/uL (ref 0–0.7)
Eosinophils Relative: 2 %
HCT: 46.9 % (ref 40.0–52.0)
Hemoglobin: 16.2 g/dL (ref 13.0–18.0)
Lymphocytes Relative: 25 %
Lymphs Abs: 1 10*3/uL (ref 1.0–3.6)
MCH: 31.7 pg (ref 26.0–34.0)
MCHC: 34.6 g/dL (ref 32.0–36.0)
MCV: 91.6 fL (ref 80.0–100.0)
Monocytes Absolute: 0.5 10*3/uL (ref 0.2–1.0)
Monocytes Relative: 13 %
Neutro Abs: 2.4 10*3/uL (ref 1.4–6.5)
Neutrophils Relative %: 59 %
Platelets: 251 10*3/uL (ref 150–440)
RBC: 5.11 MIL/uL (ref 4.40–5.90)
RDW: 13.7 % (ref 11.5–14.5)
WBC: 3.9 10*3/uL (ref 3.8–10.6)

## 2017-11-10 LAB — FERRITIN: Ferritin: 83 ng/mL (ref 24–336)

## 2017-11-10 LAB — VITAMIN B12: Vitamin B-12: 203 pg/mL (ref 180–914)

## 2017-11-10 LAB — FOLATE: Folate: 11.9 ng/mL (ref >5.9)

## 2017-11-10 NOTE — Progress Notes (Signed)
Here for follow up  Overall stated doing well.  

## 2017-11-10 NOTE — Progress Notes (Signed)
[facility]-  Cancer Center  Clinic day:  11/10/2017  Chief Complaint: Jamie Buchanan is a 42 y.o. male  with secondary polycythemia who is seen for 5 month assessment on a phlebotomy program.  HPI:  The patient was last seen in the hematology clinic on 06/16/2017.  At that time, he noted "pins and needles" sensation to the distal phalanx of his third digit on his LEFT hand, worse. Patient had no other symptoms. He continued to smoke, but plans on quitting after his birthday.  Exam was stable. Hemoglobin was 16.3 and hematocrit 46.5.  He had a phlebotomy.  He underwent phlebotomy on 07/21/2017.  Hematocrit was 47.7 and hemoglobin 16.7.  During the interim, patient is doing well overall. He continues to note a "pins and needles" sensation to his LEFT hand. Previously, the sensation was only to the distal phalanx of the 3rd digit, however it has progressed to the same location of the 4th digit. He denies any other acute symptoms.   Patient denies that he has experienced any B symptoms. He denies any interval infections. Patient advises that he maintains an adequate appetite. He is eating well. Weight today is 142 lb 4.8 oz (64.5 kg), which compared to his last visit to the clinic, represents a  3 pound increase.   Patient denies pain in the clinic today. Patient continues to smoke cigarettes and drink alcohol daily.    Past Medical History:  Diagnosis Date  . Broken femur (HCC)    Age 42  . 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 UPPER EXTREMITY VENOGRAPHY Bilateral 01/16/2017   Procedure: Upper Extremity Venography;  Surgeon: Annice Needyew, Jason S, MD;  Location: [facility] INVASIVE CV LAB;  Service: Cardiovascular;  Laterality: Bilateral;    Family History  Problem Relation Age of Onset  . Diabetes Mother   . Heart disease Mother   . Sinusitis Father     Social History:  reports that he has been smoking.  He has a 50.00 pack-year smoking  history. He has never used smokeless tobacco. He reports that he drinks alcohol. His drug history is not on file.  Patient is a 2 pack per day smoker for the last 25 years.  Patient drinks 6 beers a day. He denies illegal substance use.  He works in Engineer, mining"heating and cooling".  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  Allergies:  Allergies  Allergen Reactions  . Penicillin G Rash    Current Medications: Current Outpatient Medications  Medication Sig Dispense Refill  . aspirin EC 81 MG tablet Take 1 tablet (81 mg total) by mouth daily. 150 tablet 2  . amLODipine (NORVASC) 2.5 MG tablet Take by mouth.     No current facility-administered medications for this visit.     Review of Systems:  GENERAL:  Feels "alright, I reckon".  No fevers, sweats.  Weight up 3 pounds. PERFORMANCE STATUS (ECOG):  1 HEENT:  Runny nose (chronic).  No visual changes, sore throat, mouth sores or tenderness. Lungs: No shortness of breath or cough.  No hemoptysis.  Smoking. Cardiac:  No chest pain, palpitations, orthopnea, or PND. GI:  No nausea, vomiting, diarrhea, constipation, melena or hematochezia. GU:  No urgency, frequency, dysuria, or hematuria. Musculoskeletal:  No back pain.  No joint pain.  No muscle tenderness. Extremities:  Pins and needles sensation in left 3rd and 4th digit (DIP).  No swelling. Skin:  No rashes or skin changes. Neuro:  No  headache, numbness or weakness, balance or coordination issues. Endocrine:  No diabetes, thyroid issues, hot flashes or night sweats. Psych:  No mood changes, depression or anxiety. Pain:  No focal pain. Review of systems:  All other systems reviewed and found to be negative.   Physical Exam: Blood pressure 133/83, pulse 74, temperature (!) 96.2 F (35.7 C), temperature source Tympanic, resp. rate 18, weight 142 lb 4.8 oz (64.5 kg). GENERAL:  Well developed, well nourished, gentleman sitting comfortably in the exam room in no acute distress. MENTAL STATUS:  Alert and  oriented to person, place and time. HEAD:  Short brown hair with graying.  Normocephalic, atraumatic, face symmetric, no Cushingoid features. EYES:  Blue eyes.  Pupils equal round and reactive to light and accomodation.  No conjunctivitis or scleral icterus. ENT:  Oropharynx clear without lesion.  Tongue normal. Mucous membranes moist.  RESPIRATORY:  Clear to auscultation without rales, wheezes or rhonchi. CARDIOVASCULAR:  Regular rate and rhythm without murmur, rub or gallop. ABDOMEN:  Soft, non-tender, with active bowel sounds, and no hepatosplenomegaly.  No masses. SKIN:  No rashes, ulcers or lesions. EXTREMITIES: Calloused fingers.  Slight peeling distal 3rd digit.  No edema, no skin discoloration or tenderness.  No palpable cords. LYMPH NODES: No palpable cervical, supraclavicular, axillary or inguinal adenopathy  NEUROLOGICAL: Unremarkable. PSYCH:  Appropriate.    Orders Only on 11/10/2017  Component Date Value Ref Range Status  . Ferritin 11/10/2017 83  24 - 336 ng/mL Final   Performed at [facility], [address] [place] [place], [place] [address]  . Folate 11/10/2017 11.9  >5.9 ng/mL Final   Performed at [facility], [address] [place] [place], [place] [address]  . WBC 11/10/2017 3.9  3.8 - 10.6 K/uL Final  . RBC 11/10/2017 5.11  4.40 - 5.90 MIL/uL Final  . Hemoglobin 11/10/2017 16.2  13.0 - 18.0 g/dL Final  . HCT 09/81/1914 46.9  40.0 - 52.0 % Final  . MCV 11/10/2017 91.6  80.0 - 100.0 fL Final  . MCH 11/10/2017 31.7  26.0 - 34.0 pg Final  . MCHC 11/10/2017 34.6  32.0 - 36.0 g/dL Final  . RDW 78/29/5621 13.7  11.5 - 14.5 % Final  . Platelets 11/10/2017 251  150 - 440 K/uL Final  . Neutrophils Relative % 11/10/2017 59  % Final  . Neutro Abs 11/10/2017 2.4  1.4 - 6.5 K/uL Final  . Lymphocytes Relative 11/10/2017 25  % Final  . Lymphs Abs 11/10/2017 1.0  1.0 - 3.6 K/uL Final  . Monocytes Relative 11/10/2017 13  % Final  . Monocytes Absolute 11/10/2017 0.5  0.2 -  1.0 K/uL Final  . Eosinophils Relative 11/10/2017 2  % Final  . Eosinophils Absolute 11/10/2017 0.1  0 - 0.7 K/uL Final  . Basophils Relative 11/10/2017 1  % Final  . 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  Hemoglobin has been elevated since 05/2016.  He has a 50 pack year smoking history.  He denies testosterone use or sleep apnea.  He is on a baby aspirin.  CBC on 01/16/2017 revealed a hematocrit of 59.3, hemoglobin 20, MCV 94.8, platelets 283,000, and WBC 12,400.  Work-up on 03/06/2017 revealed a hematocrit of 60.2, hemoglobin 20.5, MCV 95.2, platelets 264,000, WBC 6500 with an ANC of 4200.  Normal  studies included: JAK2 V617F, JAK2 exon 12-15 and BCR-ABL.  Erythropoietin level was 3.3 (2.6 - 18.5).  Ferritin was 75 with an iron saturation of 19% and a TIBC of 391.  Carbon monoxide level was 8.8 (0-3.6%).  ANA was positive with a SSB (La) (ENA) antibody of 1.2 (0-0.9).  He began a phlebotomy program (last 03/31/2017).  He undergoes small volume phlebotomy (300 cc) if hemoglobin is > 16.  Angiography on 01/16/2017 revealed no evidence of Buerger's disease.   Patient drinks alcohol daily. 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 B12 was 248 (low) on 11/21/2016.  MMA was normal (280) on 03/06/2017.  Folate was 11.1.  He has not started supplemental B12.  Symptomatically, he has chronic "pins and needles" sensation in the distal phlalanx of his 3rd and 4th digit of his left hand.  He continues to smoke.  Exam is stable. Hemoglobin 16.2 and hematocrit 46.9.  Plan: 1.  Labs today:  CBC with diff, ferritin, B12, folate. 2.  Secondary polycythemia:  Review blood counts. Hemoglobin 16.2 and hematocrit 46.9.   Encourage smoking cessation and reduction strategies.  Will proceed with a small volume phlebotomy  today. 3.  Neuropathy:  Patient has a daily ETOH history.   Encourage decreasing alcohol intake.,  Encouraged to start oral B12 last visit, however he did not. Patient to start B12 1,000 mcg daily.  4.  Continue daily ASA 81 mg.  5.  RTC monthly for labs (CBC with diff) and +/- phlebotomy. 6.  RTC in 3 months for MD assessment, labs (CBC with diff), and +/- phlebotomy.   Quentin Mulling, NP  11/10/2017, 12:00 PM   I saw and evaluated the patient, participating in the key portions of the service and reviewing pertinent diagnostic studies and records.  I reviewed the nurse practitioner's note and agree with the findings and the plan.  The assessment and plan were discussed with the patient.  Several questions were asked by the patient and answered.   Nelva Nay, MD 11/10/2017,12:00 PM

## 2017-12-15 ENCOUNTER — Inpatient Hospital Stay: Payer: BLUE CROSS/BLUE SHIELD | Attending: Hematology and Oncology

## 2017-12-15 ENCOUNTER — Inpatient Hospital Stay: Payer: BLUE CROSS/BLUE SHIELD

## 2017-12-15 DIAGNOSIS — D751 Secondary polycythemia: Secondary | ICD-10-CM | POA: Diagnosis not present

## 2017-12-15 DIAGNOSIS — D538 Other specified nutritional anemias: Secondary | ICD-10-CM | POA: Insufficient documentation

## 2017-12-15 LAB — CBC WITH DIFFERENTIAL/PLATELET
Basophils Absolute: 0 10*3/uL (ref 0–0.1)
Basophils Relative: 1 %
Eosinophils Absolute: 0.1 10*3/uL (ref 0–0.7)
Eosinophils Relative: 2 %
HCT: 43.3 % (ref 40.0–52.0)
Hemoglobin: 14.8 g/dL (ref 13.0–18.0)
Lymphocytes Relative: 24 %
Lymphs Abs: 1.3 10*3/uL (ref 1.0–3.6)
MCH: 32 pg (ref 26.0–34.0)
MCHC: 34.2 g/dL (ref 32.0–36.0)
MCV: 93.5 fL (ref 80.0–100.0)
Monocytes Absolute: 0.8 10*3/uL (ref 0.2–1.0)
Monocytes Relative: 15 %
Neutro Abs: 3.2 10*3/uL (ref 1.4–6.5)
Neutrophils Relative %: 58 %
Platelets: 231 10*3/uL (ref 150–440)
RBC: 4.62 MIL/uL (ref 4.40–5.90)
RDW: 13.9 % (ref 11.5–14.5)
WBC: 5.4 10*3/uL (ref 3.8–10.6)

## 2017-12-15 LAB — VITAMIN B12: Vitamin B-12: 287 pg/mL (ref 180–914)

## 2017-12-17 ENCOUNTER — Other Ambulatory Visit: Payer: Self-pay | Admitting: Hematology and Oncology

## 2017-12-19 ENCOUNTER — Telehealth: Payer: Self-pay | Admitting: *Deleted

## 2017-12-19 NOTE — Telephone Encounter (Signed)
-----   Message from Rosey Bath, MD sent at 12/17/2017  1:02 PM EDT ----- Regarding: Please call patient  Confirm that he started oral B12 1 month ago.  His level is still low.  He needs to start B12 injections.  Orders in.  M

## 2017-12-19 NOTE — Telephone Encounter (Signed)
Called patient and LVM that his B-12 is low and will need B-12 injections.  Asked patient to call me back so I can make arrangements.

## 2017-12-21 ENCOUNTER — Telehealth: Payer: Self-pay | Admitting: *Deleted

## 2017-12-21 NOTE — Telephone Encounter (Signed)
-----   Message from Melissa C Corcoran, MD sent at 12/17/2017  1:02 PM EDT ----- Regarding: Please call patient  Confirm that he started oral B12 1 month ago.  His level is still low.  He needs to start B12 injections.  Orders in.  M  

## 2017-12-21 NOTE — Telephone Encounter (Signed)
Called and spoke to patient's mother regarding his B-12 levels.  She states he has been taking oral B-12 because she bought it for him.  I told her levels are still low and MD recommends he get B-12 injections.  I asked her to have him call back and leave me a message as to whether he is in agreement or not so I can get scheduling to get him in for the injections.  She states she will.

## 2017-12-22 ENCOUNTER — Telehealth: Payer: Self-pay | Admitting: *Deleted

## 2017-12-22 ENCOUNTER — Other Ambulatory Visit: Payer: Self-pay | Admitting: Hematology and Oncology

## 2017-12-22 NOTE — Telephone Encounter (Signed)
Patient called back and would like to proceed with B-12 injections.  Please enter orders.  Scheduling has been notified to call patient with appointment.  Thanks!

## 2017-12-22 NOTE — Telephone Encounter (Signed)
  Order in place.  M

## 2017-12-25 ENCOUNTER — Inpatient Hospital Stay: Payer: BLUE CROSS/BLUE SHIELD | Attending: Hematology and Oncology

## 2017-12-25 DIAGNOSIS — D751 Secondary polycythemia: Secondary | ICD-10-CM | POA: Insufficient documentation

## 2017-12-25 DIAGNOSIS — E538 Deficiency of other specified B group vitamins: Secondary | ICD-10-CM | POA: Insufficient documentation

## 2017-12-25 MED ORDER — CYANOCOBALAMIN 1000 MCG/ML IJ SOLN
1000.0000 ug | Freq: Once | INTRAMUSCULAR | Status: AC
  Administered 2017-12-25: 1000 ug via INTRAMUSCULAR

## 2018-01-12 ENCOUNTER — Inpatient Hospital Stay: Payer: BLUE CROSS/BLUE SHIELD

## 2018-01-12 DIAGNOSIS — D751 Secondary polycythemia: Secondary | ICD-10-CM

## 2018-01-12 DIAGNOSIS — E538 Deficiency of other specified B group vitamins: Secondary | ICD-10-CM | POA: Diagnosis not present

## 2018-01-12 LAB — CBC WITH DIFFERENTIAL/PLATELET
Basophils Absolute: 0 10*3/uL (ref 0–0.1)
Basophils Relative: 1 %
Eosinophils Absolute: 0.1 10*3/uL (ref 0–0.7)
Eosinophils Relative: 2 %
HCT: 45.1 % (ref 40.0–52.0)
Hemoglobin: 15.6 g/dL (ref 13.0–18.0)
Lymphocytes Relative: 28 %
Lymphs Abs: 1.5 10*3/uL (ref 1.0–3.6)
MCH: 32.2 pg (ref 26.0–34.0)
MCHC: 34.6 g/dL (ref 32.0–36.0)
MCV: 93.1 fL (ref 80.0–100.0)
Monocytes Absolute: 0.6 10*3/uL (ref 0.2–1.0)
Monocytes Relative: 11 %
Neutro Abs: 3.1 10*3/uL (ref 1.4–6.5)
Neutrophils Relative %: 58 %
Platelets: 253 10*3/uL (ref 150–440)
RBC: 4.85 MIL/uL (ref 4.40–5.90)
RDW: 14.5 % (ref 11.5–14.5)
WBC: 5.4 10*3/uL (ref 3.8–10.6)

## 2018-01-12 NOTE — Progress Notes (Signed)
No phlebotomy needed today per order review. Pt given copy of lab report per request.

## 2018-02-09 ENCOUNTER — Ambulatory Visit: Payer: BLUE CROSS/BLUE SHIELD

## 2018-02-09 ENCOUNTER — Other Ambulatory Visit: Payer: Self-pay | Admitting: Hematology and Oncology

## 2018-02-09 ENCOUNTER — Inpatient Hospital Stay: Payer: BLUE CROSS/BLUE SHIELD | Attending: Hematology and Oncology

## 2018-02-09 ENCOUNTER — Inpatient Hospital Stay (HOSPITAL_BASED_OUTPATIENT_CLINIC_OR_DEPARTMENT_OTHER): Payer: BLUE CROSS/BLUE SHIELD | Admitting: Urgent Care

## 2018-02-09 ENCOUNTER — Inpatient Hospital Stay: Payer: BLUE CROSS/BLUE SHIELD

## 2018-02-09 VITALS — BP 123/87 | HR 74 | Temp 97.6°F | Resp 18 | Wt 142.1 lb

## 2018-02-09 DIAGNOSIS — Z716 Tobacco abuse counseling: Secondary | ICD-10-CM

## 2018-02-09 DIAGNOSIS — F17210 Nicotine dependence, cigarettes, uncomplicated: Secondary | ICD-10-CM | POA: Insufficient documentation

## 2018-02-09 DIAGNOSIS — D751 Secondary polycythemia: Secondary | ICD-10-CM | POA: Insufficient documentation

## 2018-02-09 DIAGNOSIS — E538 Deficiency of other specified B group vitamins: Secondary | ICD-10-CM | POA: Diagnosis not present

## 2018-02-09 LAB — CBC WITH DIFFERENTIAL/PLATELET
Abs Immature Granulocytes: 0.01 10*3/uL (ref 0.00–0.07)
Basophils Absolute: 0.1 10*3/uL (ref 0.0–0.1)
Basophils Relative: 1 %
Eosinophils Absolute: 0.4 10*3/uL (ref 0.0–0.5)
Eosinophils Relative: 7 %
HCT: 46.7 % (ref 39.0–52.0)
Hemoglobin: 15.3 g/dL (ref 13.0–17.0)
Immature Granulocytes: 0 %
Lymphocytes Relative: 31 %
Lymphs Abs: 1.8 10*3/uL (ref 0.7–4.0)
MCH: 30.2 pg (ref 26.0–34.0)
MCHC: 32.8 g/dL (ref 30.0–36.0)
MCV: 92.1 fL (ref 80.0–100.0)
Monocytes Absolute: 0.6 10*3/uL (ref 0.1–1.0)
Monocytes Relative: 10 %
Neutro Abs: 2.9 10*3/uL (ref 1.7–7.7)
Neutrophils Relative %: 51 %
Platelets: 410 10*3/uL — ABNORMAL HIGH (ref 150–400)
RBC: 5.07 MIL/uL (ref 4.22–5.81)
RDW: 13 % (ref 11.5–15.5)
WBC: 5.8 10*3/uL (ref 4.0–10.5)
nRBC: 0 % (ref 0.0–0.2)

## 2018-02-09 MED ORDER — CYANOCOBALAMIN 1000 MCG/ML IJ SOLN
1000.0000 ug | Freq: Once | INTRAMUSCULAR | Status: AC
  Administered 2018-02-09: 1000 ug via INTRAMUSCULAR

## 2018-02-09 NOTE — Progress Notes (Signed)
[facility]-  Cancer Center  Clinic day:  02/09/2018  Chief Complaint: Jamie Buchanan is a 42 y.o. male  with secondary polycythemia who is seen for 3 month assessment on a phlebotomy program.  HPI:  The patient was last seen in the hematology clinic on 11/10/2017.  At that time, patient was doing well overall.  He complained of a persistent "pins-and-needles" sensation in his left hand. Patient continued to smoke cigarettes and drink alcohol daily.  Exam was grossly unremarkable.  Hemoglobin was 16.2 and hematocrit 46.9.  Patient received a small-volume therapeutic phlebotomy.  B12 was found to be low back in 10/2017.  Patient was contacted and advised to begin oral B12 1000 mcg supplementation daily.  Patient followed instructions and indicates that he remains on therapy as recommended.  CBC on 12/15/2017 revealed a WBC 5400 (ANC 3200).  Hemoglobin 14.8, hematocrit 43.3, MCV 93.5, and platelets 231,000.  He did not require a phlebotomy.  B12 level had mildly increased to 287 pg/mL.  Patient was contacted regarding the need to begin parenteral B12 supplementation.  Patient received B12 injection on 12/25/2017.  CBC on 01/12/2018 revealed a WBC 5400 (ANC 3100).  Hemoglobin 13.6, hematocrit 45.1, MCV 93.1, and platelets 253,000.  He did not require phlebotomy.  In the interim, patient continues to do well.  He denies any acute complaints today.  Patient is making efforts to cut back on the amount of alcohol that he is drinking daily.  He is also making efforts to stop smoking.  Regarding his hands, patient previously reported a "pins-and-needles" sensation in his left hand.  He now notes that his hands are intermittently numb when it is cold outside.  Previously patient was receiving Norvasc therapy to help with his hand symptoms, he notes that he is no longer taking this medication.  Patient denies that he has experienced any B symptoms. He denies any interval infections.  Patient  denies any bruising or bleeding. Patient advises that he maintains an adequate appetite. He is eating well. Weight today is 142 lb 2 oz (64.5 kg), which compared to his last visit to the clinic, represents a stable weight.  Patient denies pain in the clinic today.  Past Medical History:  Diagnosis Date  . Broken femur (HCC)    Age 54  . Buerger disease [facility])     Past Surgical History:  Procedure Laterality Date  . UPPER EXTREMITY VENOGRAPHY Bilateral 01/16/2017   Procedure: Upper Extremity Venography;  Surgeon: Annice Needy, MD;  Location: [facility] INVASIVE CV LAB;  Service: Cardiovascular;  Laterality: Bilateral;    Family History  Problem Relation Age of Onset  . Diabetes Mother   . Heart disease Mother   . Sinusitis Father     Social History:  reports that he has been smoking. He has a 50.00 pack-year smoking history. He has never used smokeless tobacco. He reports that he drinks alcohol. His drug history is not on file.  Patient is a 2 pack per day smoker for the last 25 years.  Patient drinks 6 beers a day. He denies illegal substance use.  He works in Engineer, mining and cooling".  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  Allergies:  Allergies  Allergen Reactions  . Penicillin G Rash    Current Medications: Current Outpatient Medications  Medication Sig Dispense Refill  . aspirin EC 81 MG tablet Take 1 tablet (81 mg total) by mouth daily. 150 tablet 2  . vitamin B-12 (CYANOCOBALAMIN) 1000 MCG tablet Take  1,000 mcg by mouth daily.     No current facility-administered medications for this visit.     Review of Systems  Constitutional: Negative for diaphoresis, fever, malaise/fatigue and weight loss (Stable).       "I am still doing all right".  HENT: Negative.   Eyes: Negative.   Respiratory: Negative for cough, hemoptysis, sputum production and shortness of breath.   Cardiovascular: Negative for chest pain, palpitations, orthopnea, leg swelling and PND.  Gastrointestinal: Negative for  abdominal pain, blood in stool, constipation, diarrhea, melena, nausea and vomiting.  Genitourinary: Negative for dysuria, frequency, hematuria and urgency.  Musculoskeletal: Negative for back pain, falls, joint pain and myalgias.       Pins and needle sensation to the left third and fourth digit.  Numbness in hands and cold temperatures.  Skin: Negative for itching and rash.  Neurological: Negative for dizziness, tremors, weakness and headaches.  Endo/Heme/Allergies: Does not bruise/bleed easily.  Psychiatric/Behavioral: Negative for depression, memory loss and suicidal ideas. The patient is not nervous/anxious and does not have insomnia.   All other systems reviewed and are negative.  Performance status (ECOG): 1 - Symptomatic but completely ambulatory  Vital Signs BP 123/87 (BP Location: Left Arm, Patient Position: Sitting)   Pulse 74   Temp 97.6 F (36.4 C) (Tympanic)   Resp 18   Wt 142 lb 2 oz (64.5 kg)   BMI 20.99 kg/m   Physical Exam  Constitutional: He is oriented to person, place, and time and well-developed, well-nourished, and in no distress.  HENT:  Head: Normocephalic and atraumatic.  Mouth/Throat: Oropharynx is clear and moist and mucous membranes are normal.  Eyes: Pupils are equal, round, and reactive to light. EOM are normal. No scleral icterus.  Neck: Normal range of motion. Neck supple. No tracheal deviation present. No thyromegaly present.  Cardiovascular: Normal rate, regular rhythm, normal heart sounds and intact distal pulses. Exam reveals no gallop and no friction rub.  No murmur heard. Pulmonary/Chest: Effort normal and breath sounds normal. No respiratory distress. He has no wheezes. He has no rales.  Abdominal: Soft. Bowel sounds are normal. He exhibits no distension. There is no tenderness.  Musculoskeletal: Normal range of motion. He exhibits no edema or tenderness.  Callused skin to fingers.  Peeling noted to the distal third of the third and fourth  digits on the left hand.  Neurological: He is alert and oriented to person, place, and time.  Skin: Skin is warm and dry. No rash noted. No erythema.  Psychiatric: Mood, affect and judgment normal.  Nursing note and vitals reviewed.   Appointment on 02/09/2018  Component Date Value Ref Range Status  . WBC 02/09/2018 5.8  4.0 - 10.5 K/uL Final  . RBC 02/09/2018 5.07  4.22 - 5.81 MIL/uL Final  . Hemoglobin 02/09/2018 15.3  13.0 - 17.0 g/dL Final  . HCT 16/01/9603 46.7  39.0 - 52.0 % Final  . MCV 02/09/2018 92.1  80.0 - 100.0 fL Final  . MCH 02/09/2018 30.2  26.0 - 34.0 pg Final  . MCHC 02/09/2018 32.8  30.0 - 36.0 g/dL Final  . RDW 54/12/8117 13.0  11.5 - 15.5 % Final  . Platelets 02/09/2018 410* 150 - 400 K/uL Final  . nRBC 02/09/2018 0.0  0.0 - 0.2 % Final  . Neutrophils Relative % 02/09/2018 51  % Final  . Neutro Abs 02/09/2018 2.9  1.7 - 7.7 K/uL Final  . Lymphocytes Relative 02/09/2018 31  % Final  . Lymphs Abs 02/09/2018  1.8  0.7 - 4.0 K/uL Final  . Monocytes Relative 02/09/2018 10  % Final  . Monocytes Absolute 02/09/2018 0.6  0.1 - 1.0 K/uL Final  . Eosinophils Relative 02/09/2018 7  % Final  . Eosinophils Absolute 02/09/2018 0.4  0.0 - 0.5 K/uL Final  . Basophils Relative 02/09/2018 1  % Final  . Basophils Absolute 02/09/2018 0.1  0.0 - 0.1 K/uL Final  . Immature Granulocytes 02/09/2018 0  % Final  . 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  Hemoglobin has been elevated since 05/2016.  He has a 50 pack year smoking history.  He denies testosterone use or sleep apnea.  He is on a baby aspirin.  CBC on 01/16/2017 revealed a hematocrit of 59.3, hemoglobin 20, MCV 94.8, platelets 283,000, and WBC 12,400.  Work-up on 03/06/2017 revealed a hematocrit of 60.2, hemoglobin 20.5, MCV 95.2, platelets  264,000, WBC 6500 with an ANC of 4200.  Normal studies included: JAK2 V617F, JAK2 exon 12-15 and BCR-ABL.  Erythropoietin level was 3.3 (2.6 - 18.5).  Ferritin was 75 with an iron saturation of 19% and a TIBC of 391.  Carbon monoxide level was 8.8 (0-3.6%).  ANA was positive with a SSB (La) (ENA) antibody of 1.2 (0-0.9).  He began a phlebotomy program (last 03/31/2017).  He undergoes small volume phlebotomy (300 cc) if hemoglobin is > 16.  Angiography on 01/16/2017 revealed no evidence of Buerger's disease.   Patient drinks alcohol daily. 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 B12 was 248 (low) on 11/21/2016.  MMA was normal (280) on 03/06/2017.  Folate was 11.1.  Receives B12 injections (last 12/25/2017).  Symptomatically, patient is doing well overall.  He notes that he is making efforts to cut back on his alcohol and smoking.  She continues to have "pins-and-needles" sensation to the third and fourth digits on his left hand.  Previously on Norvasc, however she has discontinued this.  Patient notes that his hands often will become numb and cold temperatures.  No B symptoms or recent infections.  Exam is grossly unremarkable.   Plan: 1. Labs today: CBC with differential 2. Secondary polycythemia  Labs reviewed.  Hemoglobin 15.3, hematocrit 46.7, MCV 92.1, and platelets 410,000.  Goal hemoglobin is < 16.  Patient will not require small-volume therapeutic phlebotomy today.  Continue routine monitoring. 3. B12 deficiency  B12 remain low at 248 when last checked on 12/15/2017.  Patient trialed oral B12 supplementation, only minimal improvement noted.    Patient began parenteral supplementation on 12/25/2017.    He was to receive weekly injections x6, followed by monthly injections.  To date, patient has only received one injection.  B12 today, then weekly x4, then monthly. 4. Alcohol and smoking cessation  Patient notes that he is making conscious efforts to reduce the  amount of alcohol and cigarettes he is using on a daily basis.  He is aware that smoking cessation will help improve his diagnosis of secondary polycythemia.  Continue cessation efforts as discussed. 5. RTC monthly for labs (CBC with diff) and +/- phlebotomy. 6. RTC in 3 months for MD assessment, labs (CBC with diff), and +/- phlebotomy.   Quentin Mulling, NP  02/09/2018, 5:22 PM

## 2018-02-09 NOTE — Progress Notes (Signed)
Patient states she has had 1 B-12 injection.  States he is taking oral B-12 as well.

## 2018-02-16 ENCOUNTER — Inpatient Hospital Stay: Payer: BLUE CROSS/BLUE SHIELD | Attending: Hematology and Oncology

## 2018-02-16 DIAGNOSIS — D751 Secondary polycythemia: Secondary | ICD-10-CM

## 2018-02-16 DIAGNOSIS — E538 Deficiency of other specified B group vitamins: Secondary | ICD-10-CM | POA: Diagnosis not present

## 2018-02-16 MED ORDER — CYANOCOBALAMIN 1000 MCG/ML IJ SOLN
1000.0000 ug | Freq: Once | INTRAMUSCULAR | Status: AC
  Administered 2018-02-16: 1000 ug via INTRAMUSCULAR

## 2018-02-23 ENCOUNTER — Inpatient Hospital Stay: Payer: BLUE CROSS/BLUE SHIELD

## 2018-03-02 ENCOUNTER — Inpatient Hospital Stay: Payer: BLUE CROSS/BLUE SHIELD

## 2018-03-09 ENCOUNTER — Ambulatory Visit: Payer: BLUE CROSS/BLUE SHIELD

## 2018-03-09 ENCOUNTER — Inpatient Hospital Stay: Payer: BLUE CROSS/BLUE SHIELD

## 2018-04-06 ENCOUNTER — Inpatient Hospital Stay: Payer: BLUE CROSS/BLUE SHIELD

## 2018-04-06 ENCOUNTER — Ambulatory Visit: Payer: BLUE CROSS/BLUE SHIELD

## 2018-05-04 ENCOUNTER — Ambulatory Visit: Payer: BLUE CROSS/BLUE SHIELD

## 2018-05-04 ENCOUNTER — Inpatient Hospital Stay: Payer: BLUE CROSS/BLUE SHIELD

## 2018-05-04 ENCOUNTER — Inpatient Hospital Stay: Payer: BLUE CROSS/BLUE SHIELD | Admitting: Hematology and Oncology
